# Patient Record
Sex: Female | Born: 1955 | Race: Black or African American | Hispanic: No | Marital: Married | Smoking: Never smoker
Health system: Southern US, Community
[De-identification: ages and names within clinical notes are randomized; demographics above are authoritative.]

## PROBLEM LIST (undated history)

## (undated) DIAGNOSIS — R51 Headache: Secondary | ICD-10-CM

## (undated) DIAGNOSIS — I10 Essential (primary) hypertension: Secondary | ICD-10-CM

## (undated) DIAGNOSIS — R011 Cardiac murmur, unspecified: Secondary | ICD-10-CM

## (undated) DIAGNOSIS — G8929 Other chronic pain: Secondary | ICD-10-CM

## (undated) DIAGNOSIS — E0590 Thyrotoxicosis, unspecified without thyrotoxic crisis or storm: Secondary | ICD-10-CM

## (undated) DIAGNOSIS — K219 Gastro-esophageal reflux disease without esophagitis: Secondary | ICD-10-CM

## (undated) DIAGNOSIS — D649 Anemia, unspecified: Secondary | ICD-10-CM

## (undated) DIAGNOSIS — R519 Headache, unspecified: Secondary | ICD-10-CM

## (undated) DIAGNOSIS — E049 Nontoxic goiter, unspecified: Secondary | ICD-10-CM

## (undated) HISTORY — PX: DILATION AND CURETTAGE OF UTERUS: SHX78

## (undated) HISTORY — PX: OTHER SURGICAL HISTORY: SHX169

## (undated) HISTORY — PX: ABDOMINAL HYSTERECTOMY: SHX81

---

## 1998-12-03 ENCOUNTER — Encounter: Payer: Self-pay | Admitting: Obstetrics and Gynecology

## 1998-12-03 ENCOUNTER — Ambulatory Visit (HOSPITAL_COMMUNITY): Admission: RE | Admit: 1998-12-03 | Discharge: 1998-12-03 | Payer: Self-pay | Admitting: Obstetrics and Gynecology

## 1999-10-22 ENCOUNTER — Ambulatory Visit (HOSPITAL_COMMUNITY): Admission: RE | Admit: 1999-10-22 | Discharge: 1999-10-22 | Payer: Self-pay | Admitting: Internal Medicine

## 1999-10-22 ENCOUNTER — Encounter: Payer: Self-pay | Admitting: Obstetrics and Gynecology

## 1999-10-27 ENCOUNTER — Inpatient Hospital Stay (HOSPITAL_COMMUNITY): Admission: RE | Admit: 1999-10-27 | Discharge: 1999-10-30 | Payer: Self-pay | Admitting: Obstetrics and Gynecology

## 1999-10-27 ENCOUNTER — Encounter (INDEPENDENT_AMBULATORY_CARE_PROVIDER_SITE_OTHER): Payer: Self-pay

## 2000-03-10 ENCOUNTER — Other Ambulatory Visit: Admission: RE | Admit: 2000-03-10 | Discharge: 2000-03-10 | Payer: Self-pay | Admitting: Obstetrics and Gynecology

## 2000-03-11 ENCOUNTER — Other Ambulatory Visit: Admission: RE | Admit: 2000-03-11 | Discharge: 2000-03-11 | Payer: Self-pay | Admitting: Endocrinology

## 2000-10-07 ENCOUNTER — Encounter (INDEPENDENT_AMBULATORY_CARE_PROVIDER_SITE_OTHER): Payer: Self-pay

## 2000-10-07 ENCOUNTER — Other Ambulatory Visit: Admission: RE | Admit: 2000-10-07 | Discharge: 2000-10-07 | Payer: Self-pay | Admitting: Obstetrics and Gynecology

## 2001-01-10 ENCOUNTER — Ambulatory Visit (HOSPITAL_COMMUNITY): Admission: RE | Admit: 2001-01-10 | Discharge: 2001-01-10 | Payer: Self-pay | Admitting: Obstetrics and Gynecology

## 2001-01-10 ENCOUNTER — Encounter: Payer: Self-pay | Admitting: Obstetrics and Gynecology

## 2001-03-07 ENCOUNTER — Encounter: Payer: Self-pay | Admitting: Gastroenterology

## 2001-03-07 ENCOUNTER — Encounter: Admission: RE | Admit: 2001-03-07 | Discharge: 2001-03-07 | Payer: Self-pay | Admitting: Gastroenterology

## 2001-06-03 ENCOUNTER — Other Ambulatory Visit: Admission: RE | Admit: 2001-06-03 | Discharge: 2001-06-03 | Payer: Self-pay | Admitting: Obstetrics and Gynecology

## 2002-02-18 ENCOUNTER — Encounter: Payer: Self-pay | Admitting: Internal Medicine

## 2002-02-18 ENCOUNTER — Ambulatory Visit (HOSPITAL_COMMUNITY): Admission: RE | Admit: 2002-02-18 | Discharge: 2002-02-18 | Payer: Self-pay | Admitting: Internal Medicine

## 2002-12-04 ENCOUNTER — Encounter: Payer: Self-pay | Admitting: Obstetrics and Gynecology

## 2002-12-04 ENCOUNTER — Ambulatory Visit (HOSPITAL_COMMUNITY): Admission: RE | Admit: 2002-12-04 | Discharge: 2002-12-04 | Payer: Self-pay | Admitting: Obstetrics and Gynecology

## 2003-01-19 ENCOUNTER — Other Ambulatory Visit: Admission: RE | Admit: 2003-01-19 | Discharge: 2003-01-19 | Payer: Self-pay | Admitting: Obstetrics and Gynecology

## 2003-07-13 ENCOUNTER — Encounter: Admission: RE | Admit: 2003-07-13 | Discharge: 2003-07-13 | Payer: Self-pay | Admitting: Internal Medicine

## 2004-03-27 ENCOUNTER — Encounter: Admission: RE | Admit: 2004-03-27 | Discharge: 2004-03-27 | Payer: Self-pay | Admitting: Internal Medicine

## 2004-06-04 ENCOUNTER — Ambulatory Visit (HOSPITAL_COMMUNITY): Admission: RE | Admit: 2004-06-04 | Discharge: 2004-06-04 | Payer: Self-pay | Admitting: Obstetrics and Gynecology

## 2005-05-27 ENCOUNTER — Ambulatory Visit (HOSPITAL_COMMUNITY): Admission: RE | Admit: 2005-05-27 | Discharge: 2005-05-27 | Payer: Self-pay | Admitting: Gastroenterology

## 2005-07-10 ENCOUNTER — Ambulatory Visit (HOSPITAL_COMMUNITY): Admission: RE | Admit: 2005-07-10 | Discharge: 2005-07-10 | Payer: Self-pay | Admitting: Obstetrics and Gynecology

## 2006-08-24 ENCOUNTER — Ambulatory Visit (HOSPITAL_COMMUNITY): Admission: RE | Admit: 2006-08-24 | Discharge: 2006-08-24 | Payer: Self-pay | Admitting: Obstetrics and Gynecology

## 2007-04-14 ENCOUNTER — Encounter: Admission: RE | Admit: 2007-04-14 | Discharge: 2007-04-14 | Payer: Self-pay | Admitting: Internal Medicine

## 2007-05-17 ENCOUNTER — Encounter (INDEPENDENT_AMBULATORY_CARE_PROVIDER_SITE_OTHER): Payer: Self-pay | Admitting: Interventional Radiology

## 2007-05-17 ENCOUNTER — Other Ambulatory Visit: Admission: RE | Admit: 2007-05-17 | Discharge: 2007-05-17 | Payer: Self-pay | Admitting: Interventional Radiology

## 2007-05-17 ENCOUNTER — Encounter: Admission: RE | Admit: 2007-05-17 | Discharge: 2007-05-17 | Payer: Self-pay | Admitting: Internal Medicine

## 2007-09-08 ENCOUNTER — Ambulatory Visit (HOSPITAL_COMMUNITY): Admission: RE | Admit: 2007-09-08 | Discharge: 2007-09-08 | Payer: Self-pay | Admitting: Internal Medicine

## 2007-09-14 ENCOUNTER — Encounter: Admission: RE | Admit: 2007-09-14 | Discharge: 2007-09-14 | Payer: Self-pay | Admitting: Internal Medicine

## 2007-11-15 ENCOUNTER — Encounter: Admission: RE | Admit: 2007-11-15 | Discharge: 2007-11-15 | Payer: Self-pay | Admitting: Endocrinology

## 2008-11-07 ENCOUNTER — Ambulatory Visit (HOSPITAL_COMMUNITY): Admission: RE | Admit: 2008-11-07 | Discharge: 2008-11-07 | Payer: Self-pay | Admitting: Obstetrics and Gynecology

## 2009-01-09 ENCOUNTER — Encounter: Admission: RE | Admit: 2009-01-09 | Discharge: 2009-01-09 | Payer: Self-pay | Admitting: Endocrinology

## 2009-07-17 ENCOUNTER — Encounter: Admission: RE | Admit: 2009-07-17 | Discharge: 2009-07-17 | Payer: Self-pay | Admitting: Endocrinology

## 2009-12-03 ENCOUNTER — Ambulatory Visit (HOSPITAL_COMMUNITY): Admission: RE | Admit: 2009-12-03 | Discharge: 2009-12-03 | Payer: Self-pay | Admitting: Obstetrics and Gynecology

## 2010-02-12 ENCOUNTER — Encounter: Admission: RE | Admit: 2010-02-12 | Discharge: 2010-02-12 | Payer: Self-pay | Admitting: Endocrinology

## 2010-06-13 ENCOUNTER — Encounter: Admission: RE | Admit: 2010-06-13 | Discharge: 2010-06-13 | Payer: Self-pay | Admitting: Internal Medicine

## 2010-08-31 ENCOUNTER — Encounter: Payer: Self-pay | Admitting: Internal Medicine

## 2010-11-05 ENCOUNTER — Other Ambulatory Visit (HOSPITAL_COMMUNITY): Payer: Self-pay | Admitting: Obstetrics and Gynecology

## 2010-11-05 DIAGNOSIS — Z1231 Encounter for screening mammogram for malignant neoplasm of breast: Secondary | ICD-10-CM

## 2010-12-08 ENCOUNTER — Ambulatory Visit (HOSPITAL_COMMUNITY)
Admission: RE | Admit: 2010-12-08 | Discharge: 2010-12-08 | Disposition: A | Discharge status: Home / Self Care | Payer: 59 | Source: Ambulatory Visit | Attending: Obstetrics and Gynecology | Admitting: Obstetrics and Gynecology

## 2010-12-08 DIAGNOSIS — Z1231 Encounter for screening mammogram for malignant neoplasm of breast: Secondary | ICD-10-CM

## 2010-12-26 NOTE — Op Note (Signed)
[name], [name]             ACCOUNT NO.:  [id number]   MEDICAL RECORD NO.:  [id number]          PATIENT TYPE:  AMB   LOCATION:  ENDO                         FACILITY:  [facility]   PHYSICIAN:  Danise Edge, M.D.   DATE OF BIRTH:  1956/06/29   DATE OF PROCEDURE:  05/27/2005  DATE OF DISCHARGE:                                 OPERATIVE REPORT   PROCEDURE:  Screening colonoscopy.   INDICATIONS:  [name] is a 55 year old female born August 07, 1956. [name] is scheduled to undergo her first screening colonoscopy  with polypectomy to prevent colon cancer.   ENDOSCOPIST:  Danise Edge, M.D.   PREMEDICATION:  Versed 4 mg, Demerol 50 mg.   DESCRIPTION OF PROCEDURE:  After obtaining informed consent, [name]  was placed in the left lateral decubitus position. I administered  intravenous Demerol and intravenous Versed to achieve conscious sedation for  the procedure. The patient's blood pressure, oxygen saturation and cardiac  rhythm were monitored throughout the procedure and documented in the medical  record.   Anal inspection and digital rectal exam were normal. The Olympus adjustable  pediatric colonoscope was introduced into the rectum and advanced to the  cecum. A normal-appearing appendiceal orifice and ileocecal valve were  identified. Colonic preparation for the exam today was excellent.   RECTUM:  Normal. Retroflexed view of the distal rectum normal.  SIGMOID COLON AND DESCENDING COLON:  Normal.  SPLENIC FLEXURE:  Normal.  TRANSVERSE COLON:  Normal.  HEPATIC FLEXURE:  Normal.  ASCENDING COLON:  Normal.  CECUM AND ILEOCECAL VALVE:  Normal.   ASSESSMENT:  Normal screening proctocolonoscopy to the cecum.           ______________________________  Danise Edge, M.D.     MJ/MEDQ  D:  05/27/2005  T:  05/27/2005  Job:  [id number]   cc:   Candyce Churn, M.D.  Fax: [phone]

## 2012-01-11 ENCOUNTER — Other Ambulatory Visit (HOSPITAL_COMMUNITY): Payer: Self-pay | Admitting: Obstetrics and Gynecology

## 2012-01-11 DIAGNOSIS — Z1231 Encounter for screening mammogram for malignant neoplasm of breast: Secondary | ICD-10-CM

## 2012-02-09 ENCOUNTER — Ambulatory Visit (HOSPITAL_COMMUNITY)
Admission: RE | Admit: 2012-02-09 | Discharge: 2012-02-09 | Disposition: A | Discharge status: Home / Self Care | Payer: 59 | Source: Ambulatory Visit | Attending: Obstetrics and Gynecology | Admitting: Obstetrics and Gynecology

## 2012-02-09 DIAGNOSIS — Z1231 Encounter for screening mammogram for malignant neoplasm of breast: Secondary | ICD-10-CM | POA: Insufficient documentation

## 2013-04-20 ENCOUNTER — Other Ambulatory Visit (HOSPITAL_COMMUNITY): Payer: Self-pay | Admitting: Obstetrics and Gynecology

## 2013-04-20 DIAGNOSIS — Z1231 Encounter for screening mammogram for malignant neoplasm of breast: Secondary | ICD-10-CM

## 2013-04-27 ENCOUNTER — Ambulatory Visit (HOSPITAL_COMMUNITY)
Admission: RE | Admit: 2013-04-27 | Discharge: 2013-04-27 | Disposition: A | Discharge status: Home / Self Care | Payer: 59 | Source: Ambulatory Visit | Attending: Obstetrics and Gynecology | Admitting: Obstetrics and Gynecology

## 2013-04-27 DIAGNOSIS — Z1231 Encounter for screening mammogram for malignant neoplasm of breast: Secondary | ICD-10-CM | POA: Insufficient documentation

## 2013-05-11 ENCOUNTER — Other Ambulatory Visit: Payer: Self-pay | Admitting: Endocrinology

## 2013-05-11 DIAGNOSIS — E049 Nontoxic goiter, unspecified: Secondary | ICD-10-CM

## 2013-06-14 ENCOUNTER — Other Ambulatory Visit: Payer: Self-pay | Admitting: Internal Medicine

## 2013-06-14 DIAGNOSIS — M542 Cervicalgia: Secondary | ICD-10-CM

## 2013-06-24 ENCOUNTER — Ambulatory Visit
Admission: RE | Admit: 2013-06-24 | Discharge: 2013-06-24 | Disposition: A | Discharge status: Home / Self Care | Payer: BC Managed Care – PPO | Source: Ambulatory Visit | Attending: Internal Medicine | Admitting: Internal Medicine

## 2013-06-24 DIAGNOSIS — M542 Cervicalgia: Secondary | ICD-10-CM

## 2014-05-04 ENCOUNTER — Ambulatory Visit
Admission: RE | Admit: 2014-05-04 | Discharge: 2014-05-04 | Disposition: A | Discharge status: Home / Self Care | Payer: 59 | Source: Ambulatory Visit | Attending: Endocrinology | Admitting: Endocrinology

## 2014-05-04 ENCOUNTER — Encounter (INDEPENDENT_AMBULATORY_CARE_PROVIDER_SITE_OTHER): Payer: Self-pay

## 2014-05-04 DIAGNOSIS — E049 Nontoxic goiter, unspecified: Secondary | ICD-10-CM

## 2014-05-30 ENCOUNTER — Other Ambulatory Visit (HOSPITAL_COMMUNITY): Payer: Self-pay | Admitting: Obstetrics and Gynecology

## 2014-05-30 DIAGNOSIS — Z1231 Encounter for screening mammogram for malignant neoplasm of breast: Secondary | ICD-10-CM

## 2014-06-07 ENCOUNTER — Ambulatory Visit (HOSPITAL_COMMUNITY)
Admission: RE | Admit: 2014-06-07 | Discharge: 2014-06-07 | Disposition: A | Discharge status: Home / Self Care | Payer: 59 | Source: Ambulatory Visit | Attending: Obstetrics and Gynecology | Admitting: Obstetrics and Gynecology

## 2014-06-07 DIAGNOSIS — Z1231 Encounter for screening mammogram for malignant neoplasm of breast: Secondary | ICD-10-CM | POA: Insufficient documentation

## 2014-08-10 DIAGNOSIS — R011 Cardiac murmur, unspecified: Secondary | ICD-10-CM

## 2014-08-10 HISTORY — DX: Cardiac murmur, unspecified: R01.1

## 2015-04-18 ENCOUNTER — Other Ambulatory Visit: Payer: Self-pay | Admitting: Nurse Practitioner

## 2015-04-18 DIAGNOSIS — R0989 Other specified symptoms and signs involving the circulatory and respiratory systems: Secondary | ICD-10-CM

## 2015-04-19 ENCOUNTER — Other Ambulatory Visit (HOSPITAL_COMMUNITY): Payer: Self-pay | Admitting: Internal Medicine

## 2015-04-19 DIAGNOSIS — I10 Essential (primary) hypertension: Secondary | ICD-10-CM

## 2015-04-22 ENCOUNTER — Ambulatory Visit (HOSPITAL_COMMUNITY): Payer: 59 | Attending: Cardiovascular Disease

## 2015-04-22 ENCOUNTER — Other Ambulatory Visit: Payer: Self-pay

## 2015-04-22 DIAGNOSIS — R011 Cardiac murmur, unspecified: Secondary | ICD-10-CM | POA: Insufficient documentation

## 2015-04-22 DIAGNOSIS — Z8249 Family history of ischemic heart disease and other diseases of the circulatory system: Secondary | ICD-10-CM | POA: Insufficient documentation

## 2015-04-22 DIAGNOSIS — I10 Essential (primary) hypertension: Secondary | ICD-10-CM | POA: Diagnosis not present

## 2015-04-23 ENCOUNTER — Ambulatory Visit
Admission: RE | Admit: 2015-04-23 | Discharge: 2015-04-23 | Disposition: A | Discharge status: Home / Self Care | Payer: 59 | Source: Ambulatory Visit | Attending: Nurse Practitioner | Admitting: Nurse Practitioner

## 2015-04-23 DIAGNOSIS — R0989 Other specified symptoms and signs involving the circulatory and respiratory systems: Secondary | ICD-10-CM

## 2015-08-11 HISTORY — PX: OTHER SURGICAL HISTORY: SHX169

## 2016-09-24 ENCOUNTER — Other Ambulatory Visit: Payer: Self-pay | Admitting: Gastroenterology

## 2016-11-19 ENCOUNTER — Encounter (HOSPITAL_COMMUNITY): Payer: Self-pay | Admitting: *Deleted

## 2016-11-24 ENCOUNTER — Encounter (HOSPITAL_COMMUNITY)
Admission: RE | Disposition: A | Discharge status: Home / Self Care | Payer: Self-pay | Source: Ambulatory Visit | Attending: Gastroenterology

## 2016-11-24 ENCOUNTER — Ambulatory Visit (HOSPITAL_COMMUNITY)
Admission: RE | Admit: 2016-11-24 | Discharge: 2016-11-24 | Disposition: A | Discharge status: Home / Self Care | Payer: 59 | Source: Ambulatory Visit | Attending: Gastroenterology | Admitting: Gastroenterology

## 2016-11-24 ENCOUNTER — Ambulatory Visit (HOSPITAL_COMMUNITY): Payer: 59 | Admitting: Anesthesiology

## 2016-11-24 ENCOUNTER — Encounter (HOSPITAL_COMMUNITY): Payer: Self-pay | Admitting: *Deleted

## 2016-11-24 DIAGNOSIS — I10 Essential (primary) hypertension: Secondary | ICD-10-CM | POA: Diagnosis not present

## 2016-11-24 DIAGNOSIS — Z90710 Acquired absence of both cervix and uterus: Secondary | ICD-10-CM | POA: Diagnosis not present

## 2016-11-24 DIAGNOSIS — M797 Fibromyalgia: Secondary | ICD-10-CM | POA: Insufficient documentation

## 2016-11-24 DIAGNOSIS — D127 Benign neoplasm of rectosigmoid junction: Secondary | ICD-10-CM | POA: Insufficient documentation

## 2016-11-24 DIAGNOSIS — E039 Hypothyroidism, unspecified: Secondary | ICD-10-CM | POA: Diagnosis not present

## 2016-11-24 DIAGNOSIS — Z1211 Encounter for screening for malignant neoplasm of colon: Secondary | ICD-10-CM | POA: Diagnosis present

## 2016-11-24 DIAGNOSIS — M5030 Other cervical disc degeneration, unspecified cervical region: Secondary | ICD-10-CM | POA: Diagnosis not present

## 2016-11-24 DIAGNOSIS — Z9851 Tubal ligation status: Secondary | ICD-10-CM | POA: Diagnosis not present

## 2016-11-24 HISTORY — DX: Other chronic pain: G89.29

## 2016-11-24 HISTORY — DX: Headache: R51

## 2016-11-24 HISTORY — DX: Thyrotoxicosis, unspecified without thyrotoxic crisis or storm: E05.90

## 2016-11-24 HISTORY — DX: Nontoxic goiter, unspecified: E04.9

## 2016-11-24 HISTORY — PX: COLONOSCOPY WITH PROPOFOL: SHX5780

## 2016-11-24 HISTORY — DX: Cardiac murmur, unspecified: R01.1

## 2016-11-24 HISTORY — DX: Anemia, unspecified: D64.9

## 2016-11-24 HISTORY — DX: Essential (primary) hypertension: I10

## 2016-11-24 HISTORY — DX: Headache, unspecified: R51.9

## 2016-11-24 HISTORY — DX: Gastro-esophageal reflux disease without esophagitis: K21.9

## 2016-11-24 SURGERY — COLONOSCOPY WITH PROPOFOL
Anesthesia: Monitor Anesthesia Care

## 2016-11-24 MED ORDER — LACTATED RINGERS IV SOLN
INTRAVENOUS | Status: DC
  Administered 2016-11-24: 1000 mL via INTRAVENOUS

## 2016-11-24 MED ORDER — PROPOFOL 500 MG/50ML IV EMUL
INTRAVENOUS | Status: DC | PRN
  Administered 2016-11-24: 30 mg via INTRAVENOUS

## 2016-11-24 MED ORDER — PROPOFOL 500 MG/50ML IV EMUL
INTRAVENOUS | Status: DC | PRN
  Administered 2016-11-24: 125 ug/kg/min via INTRAVENOUS

## 2016-11-24 MED ORDER — SODIUM CHLORIDE 0.9 % IV SOLN: INTRAVENOUS | Status: DC

## 2016-11-24 MED ORDER — PROPOFOL 10 MG/ML IV BOLUS
INTRAVENOUS | Status: AC
  Filled 2016-11-24: qty 40

## 2016-11-24 MED ORDER — PROPOFOL 10 MG/ML IV BOLUS
INTRAVENOUS | Status: AC
  Filled 2016-11-24: qty 20

## 2016-11-24 SURGICAL SUPPLY — 22 items

## 2016-11-24 NOTE — Op Note (Signed)
[facility] Patient Name: Tonya Sims Procedure Date: 11/24/2016 MRN: [id number] Attending MD: Garlan Fair , MD Date of Birth: Aug 08, 1956 CSN: [id number] Age: 61 Admit Type: Outpatient Procedure:                Colonoscopy Indications:              Screening for colorectal malignant neoplasm Providers:                Garlan Fair, MD, Zenon Mayo, RN, Cherylynn Ridges, Technician, Herbie Drape, CRNA Referring MD:              Medicines:                Propofol per Anesthesia Complications:            No immediate complications. Estimated Blood Loss:     Estimated blood loss: none. Procedure:                Pre-Anesthesia Assessment:                           - Prior to the procedure, a History and Physical                            was performed, and patient medications and                            allergies were reviewed. The patient's tolerance of                            previous anesthesia was also reviewed. The risks                            and benefits of the procedure and the sedation                            options and risks were discussed with the patient.                            All questions were answered, and informed consent                            was obtained. Prior Anticoagulants: The patient has                            taken no previous anticoagulant or antiplatelet                            agents. ASA Grade Assessment: II - A patient with                            mild systemic disease. After reviewing the risks  and benefits, the patient was deemed in                            satisfactory condition to undergo the procedure.                           After obtaining informed consent, the colonoscope                            was passed under direct vision. Throughout the                            procedure, the patient's blood pressure, pulse, and                   oxygen saturations were monitored continuously. The                            was introduced through the anus and advanced to the                            the cecum, identified by appendiceal orifice and                            ileocecal valve. The colonoscopy was performed                            without difficulty. The patient tolerated the                            procedure well. The quality of the bowel                            preparation was good. The appendiceal orifice and                            the rectum were photographed. Scope In: 1:37:03 PM Scope Out: 1:53:38 PM Scope Withdrawal Time: 0 hours 9 minutes 8 seconds  Total Procedure Duration: 0 hours 16 minutes 35 seconds  Findings:      The perianal and digital rectal examinations were normal.      The entire examined colon appeared normal except for the removal of       three diminuitive hyperplastic appearing sessile polyps from the       rectosigmoid junction with the cold biopsy forceps. Impression:               - The entire examined colon is normal except for                            the removal of three diminuitive hyperplastic                            appearing polyps . Moderate Sedation:      N/A- Per Anesthesia Care Recommendation:           - Patient has a contact number available  for                            emergencies. The signs and symptoms of potential                            delayed complications were discussed with the                            patient. Return to normal activities tomorrow.                            Written discharge instructions were provided to the                            patient.                           - Repeat colonoscopy date to be determined after                            pending pathology results are reviewed for                            screening purposes.                           - Resume previous diet.                            - Continue present medications. Procedure Code(s):        --- Professional ---                           B2841, Colorectal cancer screening; colonoscopy on                            individual not meeting criteria for high risk Diagnosis Code(s):        --- Professional ---                           Z12.11, Encounter for screening for malignant                            neoplasm of colon CPT copyright 2016 American [employer] Association. All rights reserved. The codes documented in this report are preliminary and upon coder review may  be revised to meet current compliance requirements. Earle Gell, MD Garlan Fair, MD 11/24/2016 2:01:11 PM This report has been signed electronically. Number of Addenda: 0

## 2016-11-24 NOTE — Anesthesia Postprocedure Evaluation (Signed)
Anesthesia Post Note  Patient: Tonya Sims  Procedure(s) Performed: Procedure(s) (LRB): COLONOSCOPY WITH PROPOFOL (N/A)  Patient location during evaluation: PACU Anesthesia Type: MAC Level of consciousness: awake and alert and oriented Pain management: pain level controlled Vital Signs Assessment: post-procedure vital signs reviewed and stable Respiratory status: spontaneous breathing, nonlabored ventilation and respiratory function stable Cardiovascular status: stable and blood pressure returned to baseline Postop Assessment: no signs of nausea or vomiting Anesthetic complications: no       Last Vitals:  Vitals:   11/24/16 1306 11/24/16 1400  BP: (!) 199/73 (!) 138/106  Pulse: 99 62  Resp: 11 17  Temp: 37 C 36.4 C    Last Pain:  Vitals:   11/24/16 1400  TempSrc: Oral                 Cleon Thoma A.

## 2016-11-24 NOTE — H&P (Signed)
Procedure: Screening colonoscopy. Normal screening colonoscopy was performed on 05/27/2005  History: The patient is a 61 year old female born April 11, 1956. She is scheduled to undergo a screening colonoscopy today.  Past medical history: Hypertension. Hypothyroidism. Cervical degenerative disc disease. Migraine headache syndrome. Fibromyalgia syndrome. Allergic rhinitis. Thyroid goiter surgery. D&C. Umbilical hernia repair. Tubal ligation. Total abdominal hysterectomy with BSO.  Exam: The patient is alert and lying comfortably on the endoscopy stretcher. Cardiac exam reveals a regular rhythm. Lungs are clear to auscultation. Abdomen is soft and nontender to palpation.  Plan: Proceed with screening colonoscopy

## 2016-11-24 NOTE — Discharge Instructions (Signed)

## 2016-11-24 NOTE — Transfer of Care (Signed)
Immediate Anesthesia Transfer of Care Note  Patient: Tonya Sims  Procedure(s) Performed: Procedure(s): COLONOSCOPY WITH PROPOFOL (N/A)  Patient Location: PACU  Anesthesia Type:MAC  Level of Consciousness: sedated, patient cooperative and responds to stimulation  Airway & Oxygen Therapy: Patient Spontanous Breathing and Patient connected to face mask oxygen  Post-op Assessment: Report given to RN and Post -op Vital signs reviewed and stable  Post vital signs: Reviewed and stable  Last Vitals:  Vitals:   11/24/16 1306  BP: (!) 199/73  Pulse: 99  Resp: 11  Temp: 37 C    Last Pain:  Vitals:   11/24/16 1306  TempSrc: Oral         Complications: No apparent anesthesia complications

## 2016-11-24 NOTE — Anesthesia Preprocedure Evaluation (Signed)
Anesthesia Evaluation  Patient identified by MRN, date of birth, ID band Patient awake    Reviewed: Allergy & Precautions, H&P , NPO status , Patient's Chart, lab work & pertinent test results  Airway Mallampati: II   Neck ROM: full    Dental   Pulmonary neg pulmonary ROS,    breath sounds clear to auscultation       Cardiovascular hypertension,  Rhythm:regular Rate:Normal     Neuro/Psych  Headaches,    GI/Hepatic GERD  ,  Endo/Other  Hyperthyroidism   Renal/GU      Musculoskeletal   Abdominal   Peds  Hematology  (+) anemia ,   Anesthesia Other Findings   Reproductive/Obstetrics                             Anesthesia Physical Anesthesia Plan  ASA: II  Anesthesia Plan: MAC   Post-op Pain Management:    Induction: Intravenous  Airway Management Planned: Simple Face Mask  Additional Equipment:   Intra-op Plan:   Post-operative Plan:   Informed Consent: I have reviewed the patients History and Physical, chart, labs and discussed the procedure including the risks, benefits and alternatives for the proposed anesthesia with the patient or authorized representative who has indicated his/her understanding and acceptance.     Plan Discussed with: CRNA, Anesthesiologist and Surgeon  Anesthesia Plan Comments:         Anesthesia Quick Evaluation

## 2016-11-25 ENCOUNTER — Encounter (HOSPITAL_COMMUNITY): Payer: Self-pay | Admitting: Gastroenterology

## 2017-07-28 ENCOUNTER — Other Ambulatory Visit: Payer: Self-pay | Admitting: Nephrology

## 2017-07-28 DIAGNOSIS — I10 Essential (primary) hypertension: Secondary | ICD-10-CM

## 2017-08-13 ENCOUNTER — Ambulatory Visit
Admission: RE | Admit: 2017-08-13 | Discharge: 2017-08-13 | Disposition: A | Discharge status: Home / Self Care | Payer: 59 | Source: Ambulatory Visit | Attending: Nephrology | Admitting: Nephrology

## 2017-08-13 DIAGNOSIS — I10 Essential (primary) hypertension: Secondary | ICD-10-CM

## 2018-06-02 ENCOUNTER — Other Ambulatory Visit: Payer: Self-pay | Admitting: Endocrinology

## 2018-06-02 DIAGNOSIS — E049 Nontoxic goiter, unspecified: Secondary | ICD-10-CM

## 2018-06-07 ENCOUNTER — Ambulatory Visit
Admission: RE | Admit: 2018-06-07 | Discharge: 2018-06-07 | Disposition: A | Discharge status: Home / Self Care | Payer: BC Managed Care – PPO | Source: Ambulatory Visit | Attending: Endocrinology | Admitting: Endocrinology

## 2018-06-07 DIAGNOSIS — E049 Nontoxic goiter, unspecified: Secondary | ICD-10-CM

## 2019-06-13 ENCOUNTER — Other Ambulatory Visit: Payer: Self-pay | Admitting: Endocrinology

## 2019-06-13 DIAGNOSIS — E049 Nontoxic goiter, unspecified: Secondary | ICD-10-CM

## 2019-06-22 ENCOUNTER — Ambulatory Visit
Admission: RE | Admit: 2019-06-22 | Discharge: 2019-06-22 | Disposition: A | Discharge status: Home / Self Care | Payer: BC Managed Care – PPO | Source: Ambulatory Visit | Attending: Endocrinology | Admitting: Endocrinology

## 2019-06-22 DIAGNOSIS — E049 Nontoxic goiter, unspecified: Secondary | ICD-10-CM

## 2019-09-30 ENCOUNTER — Ambulatory Visit: Payer: BC Managed Care – PPO | Attending: Internal Medicine

## 2019-09-30 DIAGNOSIS — Z23 Encounter for immunization: Secondary | ICD-10-CM | POA: Insufficient documentation

## 2019-09-30 NOTE — Progress Notes (Signed)
   Covid-19 Vaccination Clinic  Name:  GOLIE SIMONI    MRN: [id number] DOB: [birth year]  09/30/2019  Ms. Demeester was observed post Covid-19 immunization for 15 minutes without incidence. She was provided with Vaccine Information Sheet and instruction to access the V-Safe system.   Ms. Beine was instructed to call 911 with any severe reactions post vaccine: Marland Kitchen Difficulty breathing  . Swelling of your face and throat  . A fast heartbeat  . A bad rash all over your body  . Dizziness and weakness    Immunizations Administered    Name Date Dose VIS Date Route   Pfizer COVID-19 Vaccine 09/30/2019 12:01 PM 0.3 mL 07/21/2019 Intramuscular   Manufacturer: [place]   Lot: [id number]   [place]: [id number]

## 2019-10-23 ENCOUNTER — Ambulatory Visit: Payer: Medicare PPO | Attending: Internal Medicine

## 2019-10-23 DIAGNOSIS — Z23 Encounter for immunization: Secondary | ICD-10-CM

## 2019-10-23 NOTE — Progress Notes (Signed)
   Covid-19 Vaccination Clinic  Name:  REGENE EAPEN    MRN: [id number] DOB: 1956/03/05  10/23/2019  Ms. Hermosillo was observed post Covid-19 immunization for 15 minutes without incident. She was provided with Vaccine Information Sheet and instruction to access the V-Safe system.   Ms. Cuadros was instructed to call 911 with any severe reactions post vaccine: Marland Kitchen Difficulty breathing  . Swelling of face and throat  . A fast heartbeat  . A bad rash all over body  . Dizziness and weakness   Immunizations Administered    Name Date Dose VIS Date Route   Pfizer COVID-19 Vaccine 10/23/2019  8:46 AM 0.3 mL 07/21/2019 Intramuscular   Manufacturer: [place]   Lot: [id number]   [place]: [id number]

## 2020-05-08 DIAGNOSIS — I10 Essential (primary) hypertension: Secondary | ICD-10-CM | POA: Diagnosis not present

## 2020-05-08 DIAGNOSIS — D509 Iron deficiency anemia, unspecified: Secondary | ICD-10-CM | POA: Diagnosis not present

## 2020-05-08 DIAGNOSIS — E039 Hypothyroidism, unspecified: Secondary | ICD-10-CM | POA: Diagnosis not present

## 2020-05-08 DIAGNOSIS — I129 Hypertensive chronic kidney disease with stage 1 through stage 4 chronic kidney disease, or unspecified chronic kidney disease: Secondary | ICD-10-CM | POA: Diagnosis not present

## 2020-05-08 DIAGNOSIS — N2581 Secondary hyperparathyroidism of renal origin: Secondary | ICD-10-CM | POA: Diagnosis not present

## 2020-05-08 DIAGNOSIS — N182 Chronic kidney disease, stage 2 (mild): Secondary | ICD-10-CM | POA: Diagnosis not present

## 2020-05-25 ENCOUNTER — Ambulatory Visit: Payer: Medicare PPO | Attending: Internal Medicine

## 2020-05-25 DIAGNOSIS — Z23 Encounter for immunization: Secondary | ICD-10-CM

## 2020-05-25 NOTE — Progress Notes (Signed)
   Covid-19 Vaccination Clinic  Name:  VELECIA OVITT    MRN: [id number] DOB: 1955-10-08  05/25/2020  Ms. Haan was observed post Covid-19 immunization for 15 minutes without incident. She was provided with Vaccine Information Sheet and instruction to access the V-Safe system.   Ms. Whittingham was instructed to call 911 with any severe reactions post vaccine: Marland Kitchen Difficulty breathing  . Swelling of face and throat  . A fast heartbeat  . A bad rash all over body  . Dizziness and weakness

## 2020-05-28 DIAGNOSIS — E890 Postprocedural hypothyroidism: Secondary | ICD-10-CM | POA: Diagnosis not present

## 2020-05-30 DIAGNOSIS — H16223 Keratoconjunctivitis sicca, not specified as Sjogren's, bilateral: Secondary | ICD-10-CM | POA: Diagnosis not present

## 2020-06-04 DIAGNOSIS — E049 Nontoxic goiter, unspecified: Secondary | ICD-10-CM | POA: Diagnosis not present

## 2020-06-04 DIAGNOSIS — E890 Postprocedural hypothyroidism: Secondary | ICD-10-CM | POA: Diagnosis not present

## 2020-06-04 DIAGNOSIS — I10 Essential (primary) hypertension: Secondary | ICD-10-CM | POA: Diagnosis not present

## 2020-06-24 DIAGNOSIS — Z23 Encounter for immunization: Secondary | ICD-10-CM | POA: Diagnosis not present

## 2020-07-31 DIAGNOSIS — E890 Postprocedural hypothyroidism: Secondary | ICD-10-CM | POA: Diagnosis not present

## 2020-10-10 ENCOUNTER — Other Ambulatory Visit: Payer: Self-pay | Admitting: Internal Medicine

## 2020-10-10 DIAGNOSIS — Z0001 Encounter for general adult medical examination with abnormal findings: Secondary | ICD-10-CM | POA: Diagnosis not present

## 2020-10-10 DIAGNOSIS — E559 Vitamin D deficiency, unspecified: Secondary | ICD-10-CM | POA: Diagnosis not present

## 2020-10-10 DIAGNOSIS — Z79899 Other long term (current) drug therapy: Secondary | ICD-10-CM | POA: Diagnosis not present

## 2020-10-10 DIAGNOSIS — I779 Disorder of arteries and arterioles, unspecified: Secondary | ICD-10-CM | POA: Diagnosis not present

## 2020-10-10 DIAGNOSIS — E039 Hypothyroidism, unspecified: Secondary | ICD-10-CM | POA: Diagnosis not present

## 2020-10-10 DIAGNOSIS — Z13820 Encounter for screening for osteoporosis: Secondary | ICD-10-CM

## 2020-10-10 DIAGNOSIS — G4700 Insomnia, unspecified: Secondary | ICD-10-CM | POA: Diagnosis not present

## 2020-10-10 DIAGNOSIS — I10 Essential (primary) hypertension: Secondary | ICD-10-CM | POA: Diagnosis not present

## 2020-10-10 DIAGNOSIS — D509 Iron deficiency anemia, unspecified: Secondary | ICD-10-CM | POA: Diagnosis not present

## 2020-10-15 ENCOUNTER — Other Ambulatory Visit: Payer: Medicare PPO

## 2020-10-16 DIAGNOSIS — I5032 Chronic diastolic (congestive) heart failure: Secondary | ICD-10-CM | POA: Diagnosis not present

## 2020-10-16 DIAGNOSIS — N1831 Chronic kidney disease, stage 3a: Secondary | ICD-10-CM | POA: Diagnosis not present

## 2020-10-16 DIAGNOSIS — I129 Hypertensive chronic kidney disease with stage 1 through stage 4 chronic kidney disease, or unspecified chronic kidney disease: Secondary | ICD-10-CM | POA: Diagnosis not present

## 2020-10-16 DIAGNOSIS — K219 Gastro-esophageal reflux disease without esophagitis: Secondary | ICD-10-CM | POA: Diagnosis not present

## 2020-10-16 DIAGNOSIS — N182 Chronic kidney disease, stage 2 (mild): Secondary | ICD-10-CM | POA: Diagnosis not present

## 2020-10-23 DIAGNOSIS — Z1231 Encounter for screening mammogram for malignant neoplasm of breast: Secondary | ICD-10-CM | POA: Diagnosis not present

## 2020-11-26 ENCOUNTER — Other Ambulatory Visit: Payer: Self-pay

## 2020-11-26 ENCOUNTER — Ambulatory Visit
Admission: RE | Admit: 2020-11-26 | Discharge: 2020-11-26 | Disposition: A | Discharge status: Home / Self Care | Payer: Medicare PPO | Source: Ambulatory Visit | Attending: Internal Medicine | Admitting: Internal Medicine

## 2020-11-26 DIAGNOSIS — Z780 Asymptomatic menopausal state: Secondary | ICD-10-CM | POA: Diagnosis not present

## 2020-11-26 DIAGNOSIS — Z13820 Encounter for screening for osteoporosis: Secondary | ICD-10-CM

## 2021-01-03 ENCOUNTER — Ambulatory Visit: Payer: Medicare PPO | Attending: Family

## 2021-01-03 DIAGNOSIS — Z23 Encounter for immunization: Secondary | ICD-10-CM

## 2021-01-03 NOTE — Progress Notes (Signed)
   Covid-19 Vaccination Clinic  Name:  ANTONEA GAUT    MRN: [id number] DOB: 1956/05/04  01/03/2021  Ms. Lukach was observed post Covid-19 immunization for 15 minutes without incident. She was provided with Vaccine Information Sheet and instruction to access the V-Safe system.   Ms. Oscarson was instructed to call 911 with any severe reactions post vaccine: Marland Kitchen Difficulty breathing  . Swelling of face and throat  . A fast heartbeat  . A bad rash all over body  . Dizziness and weakness   Immunizations Administered    Name Date Dose VIS Date Route   Pfizer COVID-19 Vaccine 01/03/2021  3:07 PM 0.3 mL 05/29/2020 Intramuscular   Manufacturer: [place]   Lot: [id number]   [place]: [address]-3

## 2021-04-17 DIAGNOSIS — N1831 Chronic kidney disease, stage 3a: Secondary | ICD-10-CM | POA: Diagnosis not present

## 2021-04-17 DIAGNOSIS — I129 Hypertensive chronic kidney disease with stage 1 through stage 4 chronic kidney disease, or unspecified chronic kidney disease: Secondary | ICD-10-CM | POA: Diagnosis not present

## 2021-04-17 DIAGNOSIS — I5032 Chronic diastolic (congestive) heart failure: Secondary | ICD-10-CM | POA: Diagnosis not present

## 2021-04-17 DIAGNOSIS — E049 Nontoxic goiter, unspecified: Secondary | ICD-10-CM | POA: Diagnosis not present

## 2021-05-26 DIAGNOSIS — Z0000 Encounter for general adult medical examination without abnormal findings: Secondary | ICD-10-CM | POA: Diagnosis not present

## 2021-05-26 DIAGNOSIS — Z124 Encounter for screening for malignant neoplasm of cervix: Secondary | ICD-10-CM | POA: Diagnosis not present

## 2021-05-26 DIAGNOSIS — Z90710 Acquired absence of both cervix and uterus: Secondary | ICD-10-CM | POA: Diagnosis not present

## 2021-05-28 DIAGNOSIS — E890 Postprocedural hypothyroidism: Secondary | ICD-10-CM | POA: Diagnosis not present

## 2021-06-04 DIAGNOSIS — E890 Postprocedural hypothyroidism: Secondary | ICD-10-CM | POA: Diagnosis not present

## 2021-06-04 DIAGNOSIS — I10 Essential (primary) hypertension: Secondary | ICD-10-CM | POA: Diagnosis not present

## 2021-06-04 DIAGNOSIS — E049 Nontoxic goiter, unspecified: Secondary | ICD-10-CM | POA: Diagnosis not present

## 2021-06-06 ENCOUNTER — Other Ambulatory Visit: Payer: Self-pay | Admitting: Endocrinology

## 2021-06-06 DIAGNOSIS — E049 Nontoxic goiter, unspecified: Secondary | ICD-10-CM

## 2021-06-06 DIAGNOSIS — Z23 Encounter for immunization: Secondary | ICD-10-CM | POA: Diagnosis not present

## 2021-06-23 ENCOUNTER — Ambulatory Visit
Admission: RE | Admit: 2021-06-23 | Discharge: 2021-06-23 | Disposition: A | Discharge status: Home / Self Care | Payer: Medicare PPO | Source: Ambulatory Visit | Attending: Endocrinology | Admitting: Endocrinology

## 2021-06-23 DIAGNOSIS — E049 Nontoxic goiter, unspecified: Secondary | ICD-10-CM | POA: Diagnosis not present

## 2021-06-23 DIAGNOSIS — E890 Postprocedural hypothyroidism: Secondary | ICD-10-CM | POA: Diagnosis not present

## 2021-06-25 ENCOUNTER — Ambulatory Visit: Payer: Medicare PPO | Attending: Family

## 2021-06-25 DIAGNOSIS — Z23 Encounter for immunization: Secondary | ICD-10-CM

## 2021-06-25 NOTE — Progress Notes (Signed)
   Covid-19 Vaccination Clinic  Name:  Tonya Sims    MRN: [id number] DOB: Oct 25, 1955  06/25/2021  Ms. Stickley was observed post Covid-19 immunization for 15 minutes without incident. She was provided with Vaccine Information Sheet and instruction to access the V-Safe system.   Ms. Jasmer was instructed to call 911 with any severe reactions post vaccine: Difficulty breathing  Swelling of face and throat  A fast heartbeat  A bad rash all over body  Dizziness and weakness   Immunizations Administered     Name Date Dose VIS Date Route   Pfizer Covid-19 Vaccine Bivalent Booster 06/25/2021 11:00 AM 0.3 mL 04/09/2021 Intramuscular   Manufacturer: [place]   Lot: [id number]   [place]: [phone]

## 2021-08-06 DIAGNOSIS — E890 Postprocedural hypothyroidism: Secondary | ICD-10-CM | POA: Diagnosis not present

## 2021-10-15 ENCOUNTER — Other Ambulatory Visit: Payer: Self-pay | Admitting: Internal Medicine

## 2021-10-15 ENCOUNTER — Ambulatory Visit
Admission: RE | Admit: 2021-10-15 | Discharge: 2021-10-15 | Disposition: A | Discharge status: Home / Self Care | Payer: Medicare PPO | Source: Ambulatory Visit | Attending: Internal Medicine | Admitting: Internal Medicine

## 2021-10-15 DIAGNOSIS — M25562 Pain in left knee: Secondary | ICD-10-CM

## 2021-10-15 DIAGNOSIS — K219 Gastro-esophageal reflux disease without esophagitis: Secondary | ICD-10-CM | POA: Diagnosis not present

## 2021-10-15 DIAGNOSIS — E039 Hypothyroidism, unspecified: Secondary | ICD-10-CM | POA: Diagnosis not present

## 2021-10-15 DIAGNOSIS — Z23 Encounter for immunization: Secondary | ICD-10-CM | POA: Diagnosis not present

## 2021-10-15 DIAGNOSIS — I779 Disorder of arteries and arterioles, unspecified: Secondary | ICD-10-CM | POA: Diagnosis not present

## 2021-10-15 DIAGNOSIS — I10 Essential (primary) hypertension: Secondary | ICD-10-CM | POA: Diagnosis not present

## 2021-10-15 DIAGNOSIS — G4700 Insomnia, unspecified: Secondary | ICD-10-CM | POA: Diagnosis not present

## 2021-10-15 DIAGNOSIS — M170 Bilateral primary osteoarthritis of knee: Secondary | ICD-10-CM | POA: Diagnosis not present

## 2021-10-15 DIAGNOSIS — Z0001 Encounter for general adult medical examination with abnormal findings: Secondary | ICD-10-CM | POA: Diagnosis not present

## 2021-10-15 DIAGNOSIS — E559 Vitamin D deficiency, unspecified: Secondary | ICD-10-CM | POA: Diagnosis not present

## 2021-10-15 DIAGNOSIS — D509 Iron deficiency anemia, unspecified: Secondary | ICD-10-CM | POA: Diagnosis not present

## 2021-10-15 DIAGNOSIS — M5412 Radiculopathy, cervical region: Secondary | ICD-10-CM | POA: Diagnosis not present

## 2021-10-17 DIAGNOSIS — I779 Disorder of arteries and arterioles, unspecified: Secondary | ICD-10-CM | POA: Diagnosis not present

## 2021-10-30 DIAGNOSIS — Z1231 Encounter for screening mammogram for malignant neoplasm of breast: Secondary | ICD-10-CM | POA: Diagnosis not present

## 2021-11-25 IMAGING — US US THYROID
1 series · 14 of 25 positions shown · non-contrast
Comparison: 06/22/2019

CLINICAL DATA: Remote right thyroidectomy, thyroid goiter

EXAM:
THYROID ULTRASOUND
TECHNIQUE: Ultrasound examination of the thyroid gland and adjacent soft
tissues was performed.

[Series 1: us thyroid · 0.06mm/px · 14 of 28 slices shown]
[im 1/28]
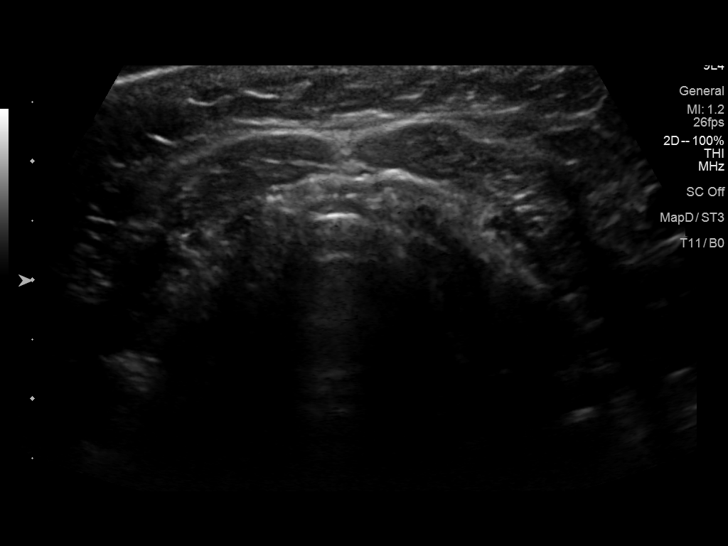
[im 3/28]
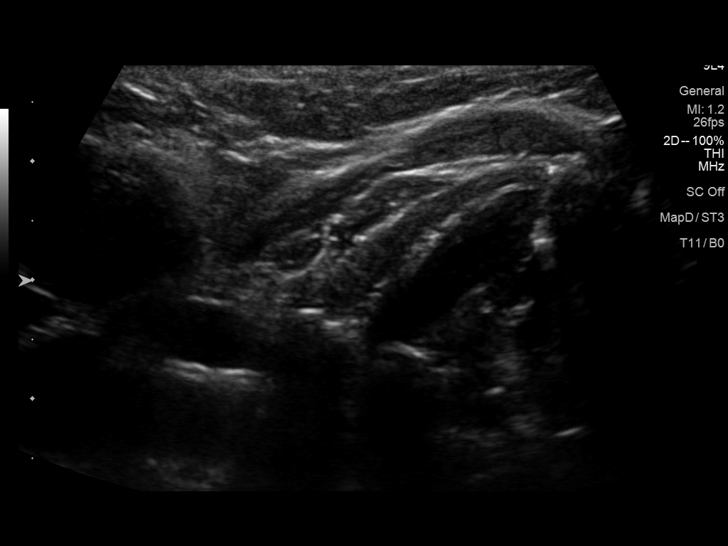
[im 5/28]
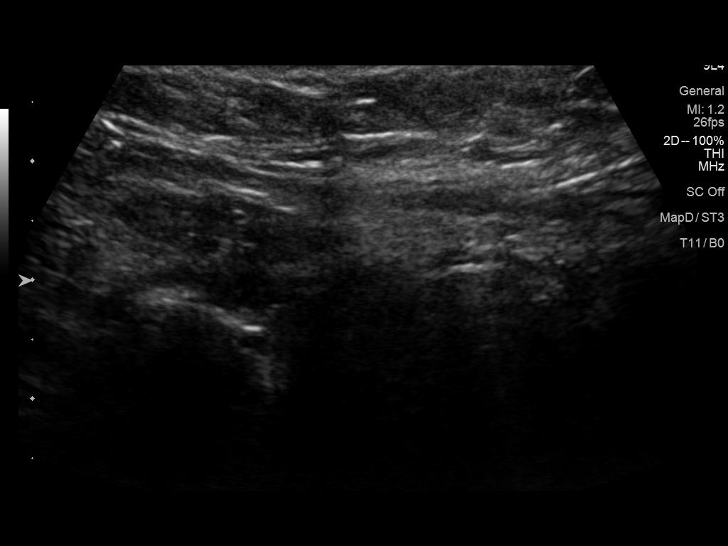
[im 7/28]
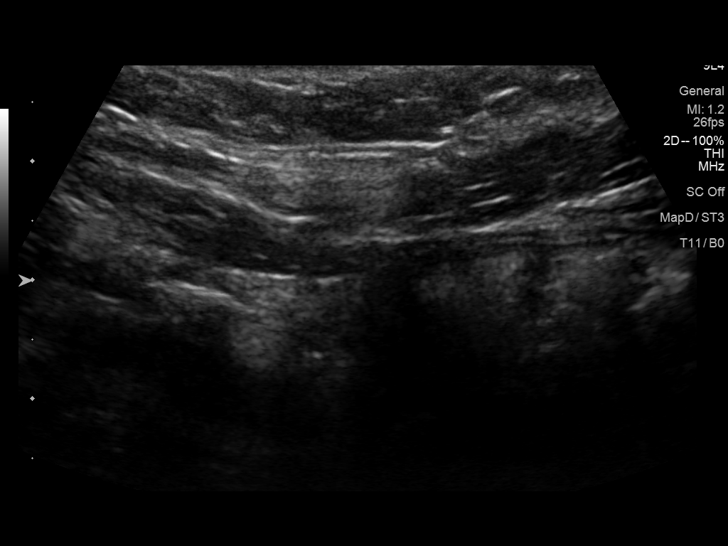
[im 10/28]
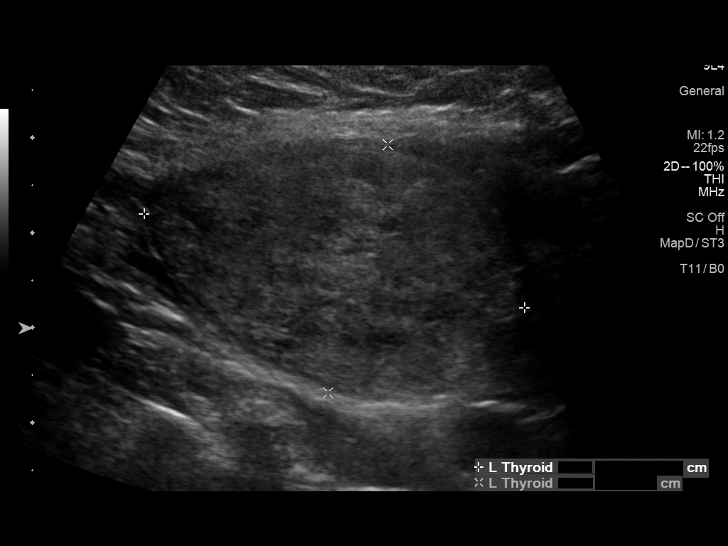
[im 11/28]
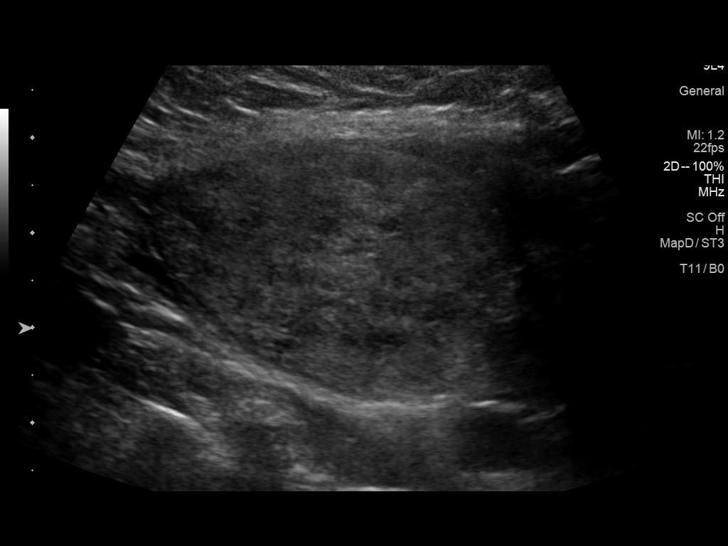
[im 13/28]
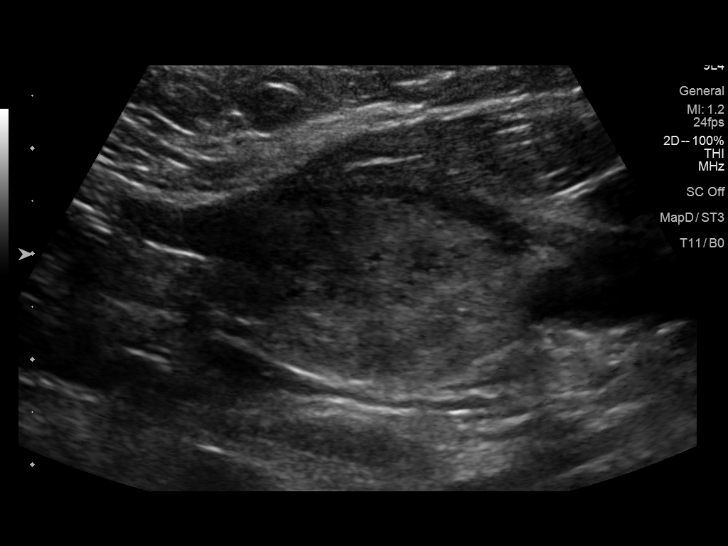
[im 15/28]
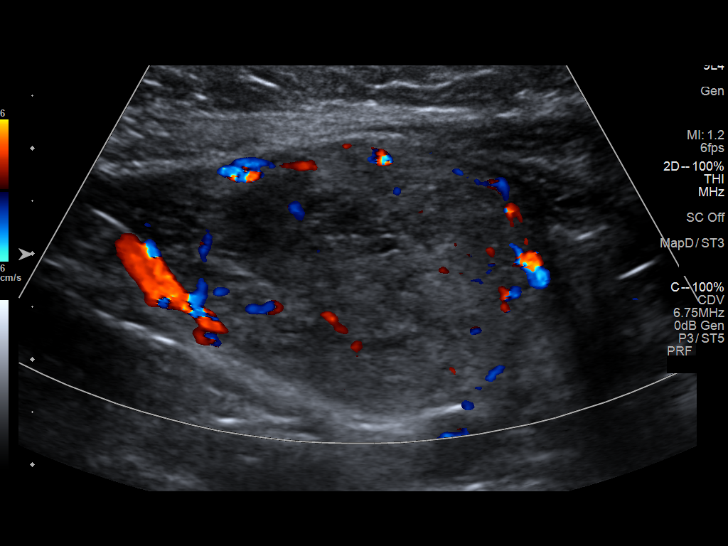
[im 17/28]
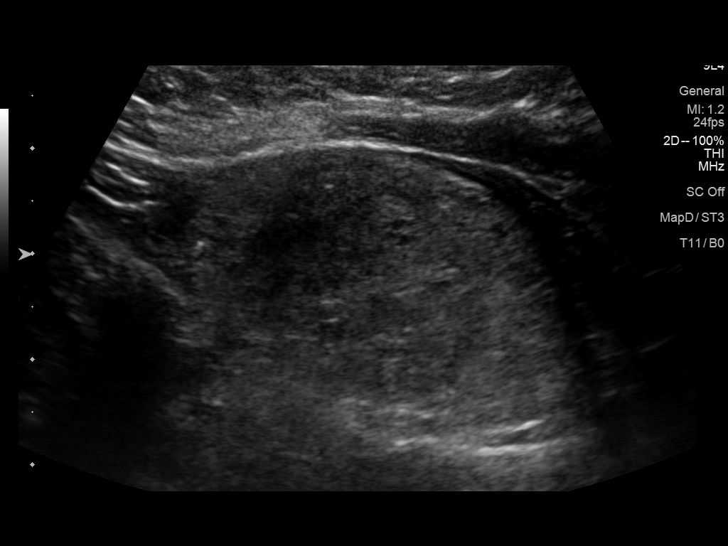
[im 19/28]
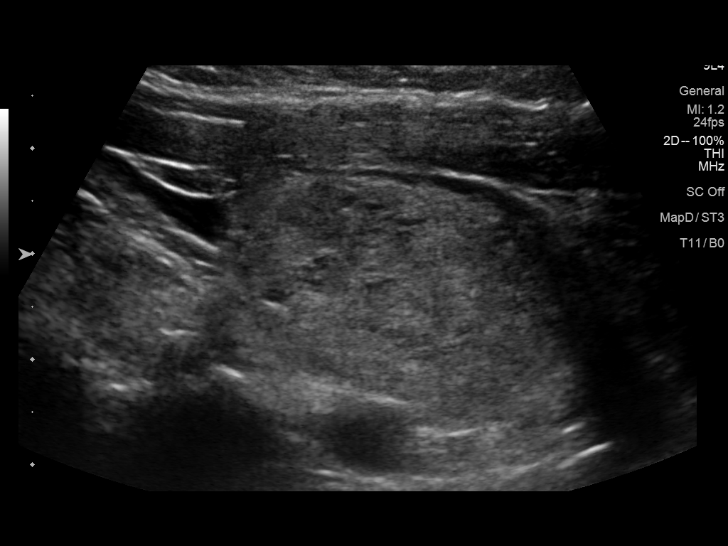
[im 21/28]
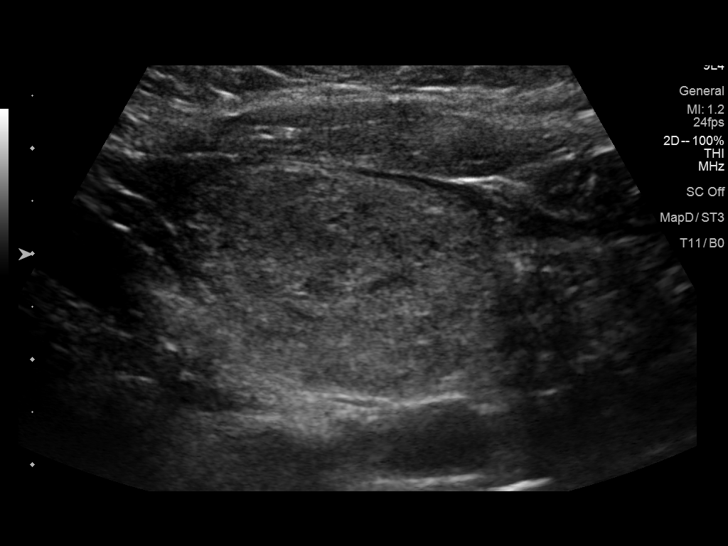
[im 23/28]
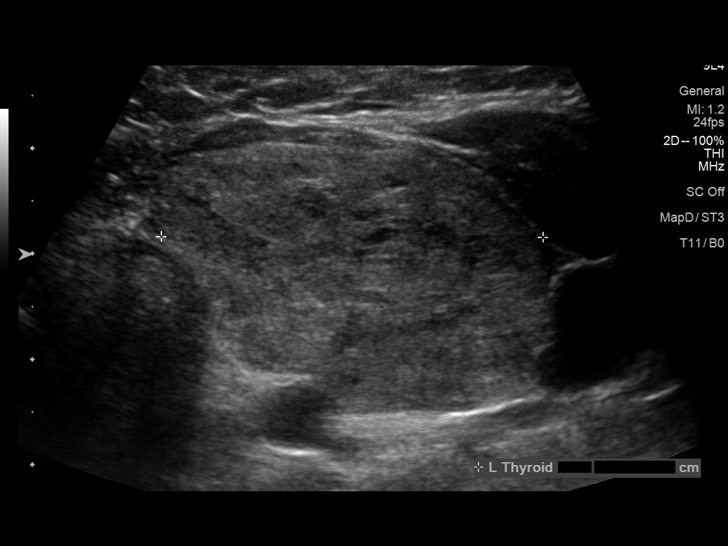
[im 25/28]
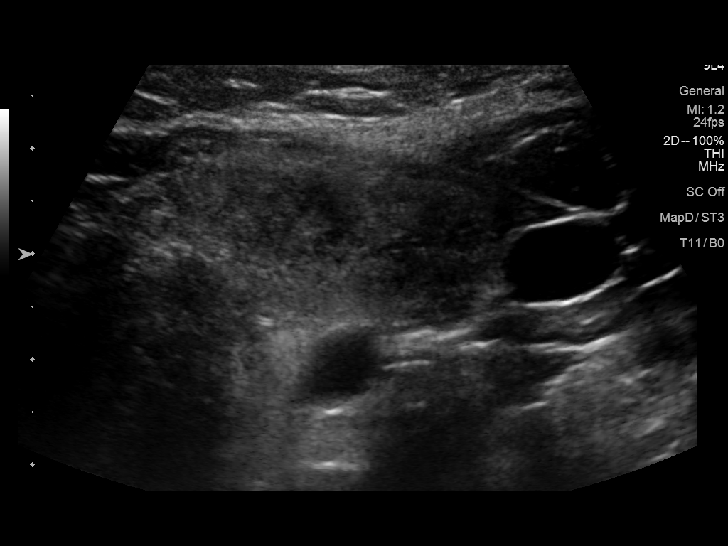
[im 28/28]
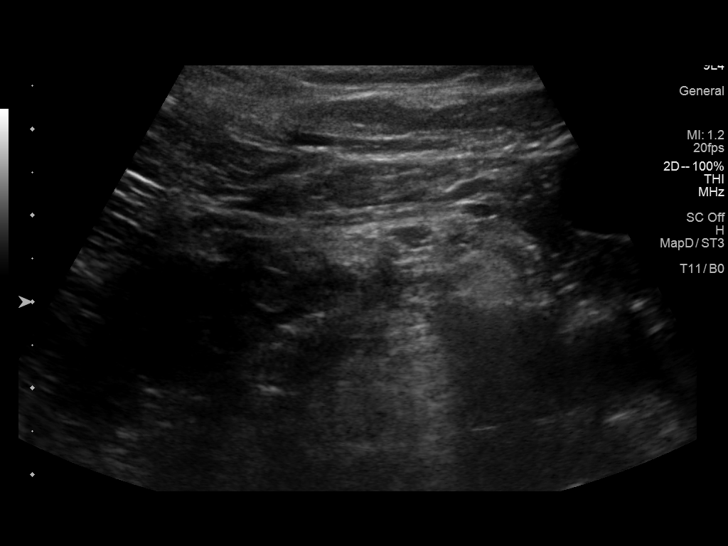

[14 of 25 positions shown; findings below may reference images not displayed]

FINDINGS: Parenchymal Echotexture: Markedly heterogenous

Isthmus: Surgically absent

Right lobe: Surgically absent

Left lobe: 4.1 x 2.7 x 3.6 cm

_________________________________________________________

Estimated total number of nodules >/= 1 cm: 0

Number of spongiform nodules >/=  2 cm not described below (TR1): 0

Number of mixed cystic and solid nodules >/= 1.5 cm not described
below (TR2): 0

_________________________________________________________

No right thyroid bed abnormality or residual thyroid tissue
appreciated. Negative for nodule.

Stable marked heterogeneous left thyroid lobe diffusely without
hypervascularity or developing nodule.

No new thyroid abnormality.  No regional adenopathy.
IMPRESSION: Remote right thyroidectomy.  No right thyroid bed abnormality.

Stable markedly heterogeneous left thyroid lobe without nodule.

The above is in keeping with the ACR TI-RADS recommendations - [HOSPITAL] 0052;[DATE].

## 2021-12-19 DIAGNOSIS — E039 Hypothyroidism, unspecified: Secondary | ICD-10-CM | POA: Diagnosis not present

## 2021-12-19 DIAGNOSIS — I129 Hypertensive chronic kidney disease with stage 1 through stage 4 chronic kidney disease, or unspecified chronic kidney disease: Secondary | ICD-10-CM | POA: Diagnosis not present

## 2021-12-19 DIAGNOSIS — N1831 Chronic kidney disease, stage 3a: Secondary | ICD-10-CM | POA: Diagnosis not present

## 2021-12-19 DIAGNOSIS — I5032 Chronic diastolic (congestive) heart failure: Secondary | ICD-10-CM | POA: Diagnosis not present

## 2022-01-14 DIAGNOSIS — Z86010 Personal history of colonic polyps: Secondary | ICD-10-CM | POA: Diagnosis not present

## 2022-01-14 DIAGNOSIS — K219 Gastro-esophageal reflux disease without esophagitis: Secondary | ICD-10-CM | POA: Diagnosis not present

## 2022-03-19 IMAGING — DX DG KNEE STANDING AP BILAT
1 series · 1 of 1 positions shown · non-contrast
Comparison: None.

CLINICAL DATA: Pain left knee

EXAM:
BILATERAL KNEES STANDING - 1 VIEW

[dg knee bilateral standing ap]
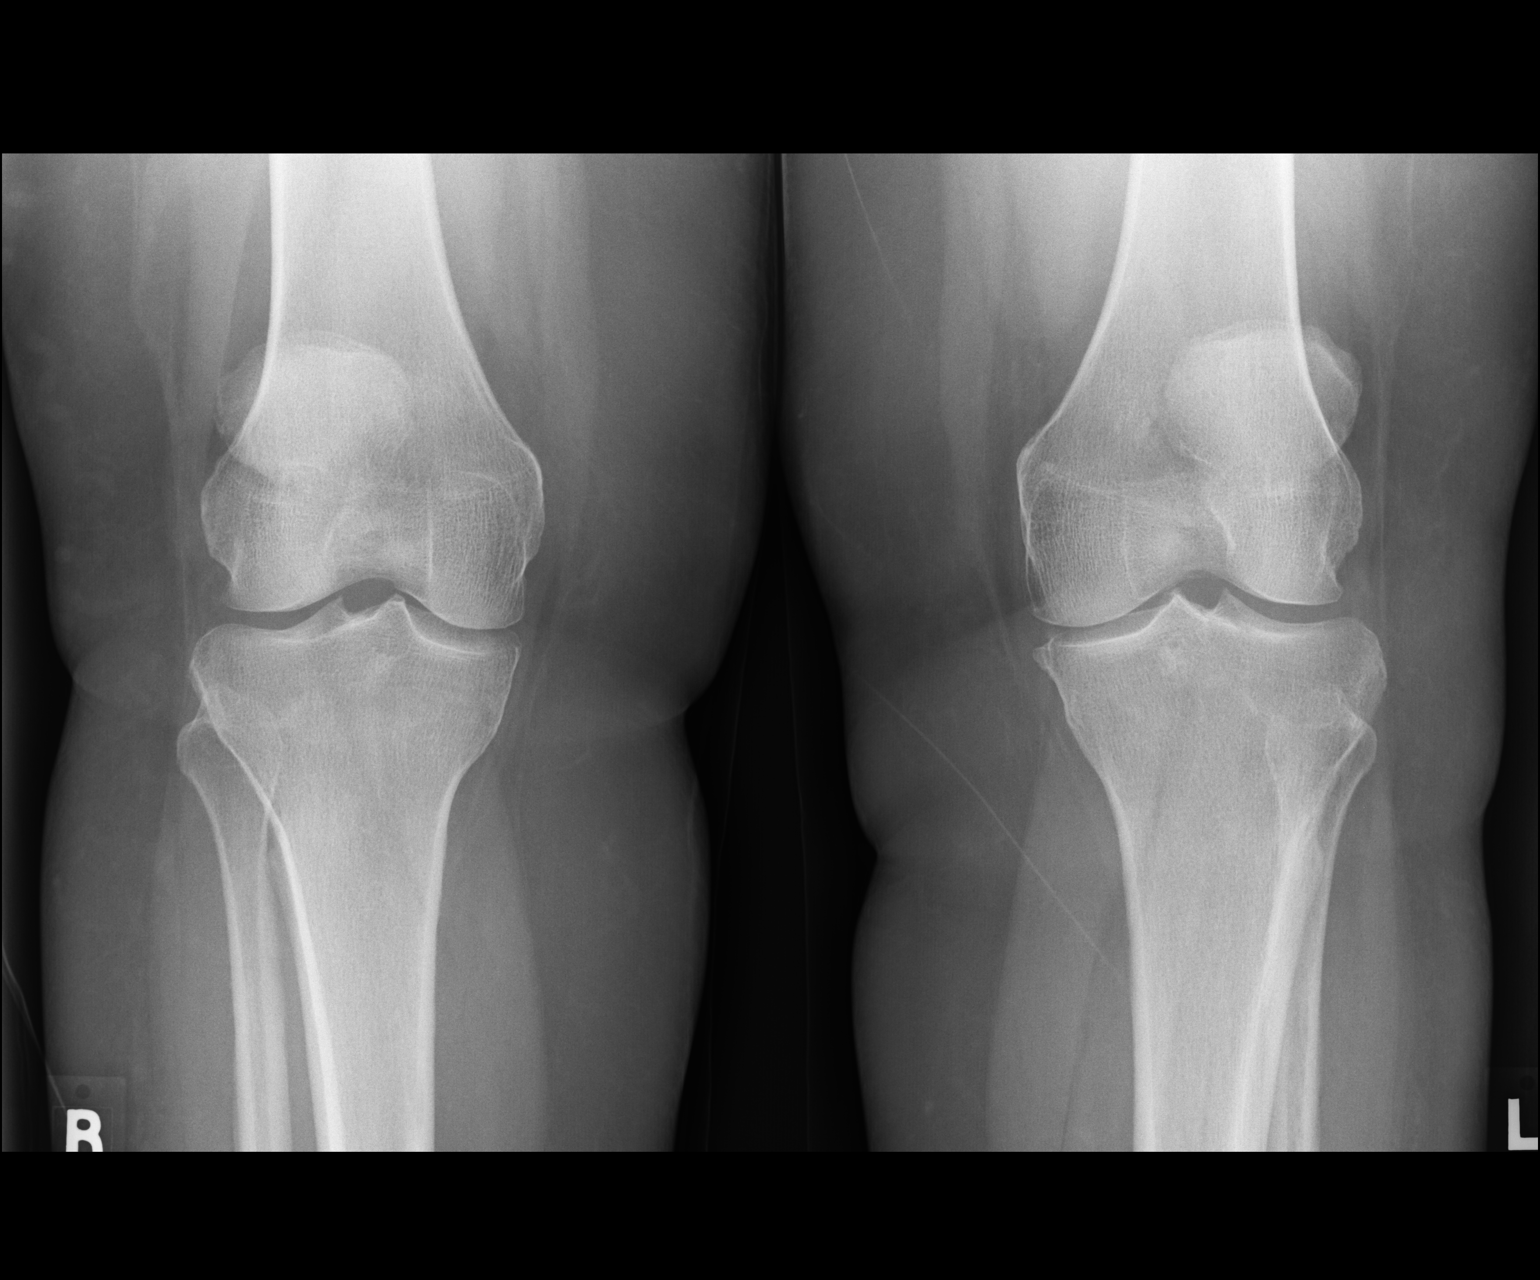

[1 of 1 positions shown; findings below may reference images not displayed]

FINDINGS: No fracture is seen. Degenerative changes are noted with joint space
narrowing and bony spurs in the medial compartment.
IMPRESSION: Limited AP views of both knees show degenerative changes in the
medial compartment in the left knee.

## 2022-05-05 DIAGNOSIS — K5730 Diverticulosis of large intestine without perforation or abscess without bleeding: Secondary | ICD-10-CM | POA: Diagnosis not present

## 2022-05-05 DIAGNOSIS — K635 Polyp of colon: Secondary | ICD-10-CM | POA: Diagnosis not present

## 2022-05-05 DIAGNOSIS — K621 Rectal polyp: Secondary | ICD-10-CM | POA: Diagnosis not present

## 2022-05-05 DIAGNOSIS — K317 Polyp of stomach and duodenum: Secondary | ICD-10-CM | POA: Diagnosis not present

## 2022-05-05 DIAGNOSIS — Z09 Encounter for follow-up examination after completed treatment for conditions other than malignant neoplasm: Secondary | ICD-10-CM | POA: Diagnosis not present

## 2022-05-05 DIAGNOSIS — K6389 Other specified diseases of intestine: Secondary | ICD-10-CM | POA: Diagnosis not present

## 2022-05-05 DIAGNOSIS — R12 Heartburn: Secondary | ICD-10-CM | POA: Diagnosis not present

## 2022-05-05 DIAGNOSIS — Z86010 Personal history of colonic polyps: Secondary | ICD-10-CM | POA: Diagnosis not present

## 2022-05-05 DIAGNOSIS — K648 Other hemorrhoids: Secondary | ICD-10-CM | POA: Diagnosis not present

## 2022-05-07 DIAGNOSIS — K635 Polyp of colon: Secondary | ICD-10-CM | POA: Diagnosis not present

## 2022-05-07 DIAGNOSIS — K621 Rectal polyp: Secondary | ICD-10-CM | POA: Diagnosis not present

## 2022-05-07 DIAGNOSIS — K317 Polyp of stomach and duodenum: Secondary | ICD-10-CM | POA: Diagnosis not present

## 2022-05-27 DIAGNOSIS — Z01419 Encounter for gynecological examination (general) (routine) without abnormal findings: Secondary | ICD-10-CM | POA: Diagnosis not present

## 2022-05-27 DIAGNOSIS — Z90710 Acquired absence of both cervix and uterus: Secondary | ICD-10-CM | POA: Diagnosis not present

## 2022-05-27 DIAGNOSIS — E039 Hypothyroidism, unspecified: Secondary | ICD-10-CM | POA: Diagnosis not present

## 2022-05-27 DIAGNOSIS — I10 Essential (primary) hypertension: Secondary | ICD-10-CM | POA: Diagnosis not present

## 2022-06-03 DIAGNOSIS — E890 Postprocedural hypothyroidism: Secondary | ICD-10-CM | POA: Diagnosis not present

## 2022-06-10 DIAGNOSIS — E049 Nontoxic goiter, unspecified: Secondary | ICD-10-CM | POA: Diagnosis not present

## 2022-06-10 DIAGNOSIS — I10 Essential (primary) hypertension: Secondary | ICD-10-CM | POA: Diagnosis not present

## 2022-06-10 DIAGNOSIS — E890 Postprocedural hypothyroidism: Secondary | ICD-10-CM | POA: Diagnosis not present

## 2022-06-22 DIAGNOSIS — Z23 Encounter for immunization: Secondary | ICD-10-CM | POA: Diagnosis not present

## 2022-09-24 DIAGNOSIS — E875 Hyperkalemia: Secondary | ICD-10-CM | POA: Diagnosis not present

## 2022-09-24 DIAGNOSIS — I5032 Chronic diastolic (congestive) heart failure: Secondary | ICD-10-CM | POA: Diagnosis not present

## 2022-09-24 DIAGNOSIS — I1A0 Resistant hypertension: Secondary | ICD-10-CM | POA: Diagnosis not present

## 2022-09-24 DIAGNOSIS — I130 Hypertensive heart and chronic kidney disease with heart failure and stage 1 through stage 4 chronic kidney disease, or unspecified chronic kidney disease: Secondary | ICD-10-CM | POA: Diagnosis not present

## 2022-09-24 DIAGNOSIS — N390 Urinary tract infection, site not specified: Secondary | ICD-10-CM | POA: Diagnosis not present

## 2022-09-24 DIAGNOSIS — N1831 Chronic kidney disease, stage 3a: Secondary | ICD-10-CM | POA: Diagnosis not present

## 2022-09-24 DIAGNOSIS — E039 Hypothyroidism, unspecified: Secondary | ICD-10-CM | POA: Diagnosis not present

## 2022-11-19 DIAGNOSIS — Z1231 Encounter for screening mammogram for malignant neoplasm of breast: Secondary | ICD-10-CM | POA: Diagnosis not present

## 2022-12-02 DIAGNOSIS — Z1331 Encounter for screening for depression: Secondary | ICD-10-CM | POA: Diagnosis not present

## 2022-12-02 DIAGNOSIS — E7800 Pure hypercholesterolemia, unspecified: Secondary | ICD-10-CM | POA: Diagnosis not present

## 2022-12-02 DIAGNOSIS — G4700 Insomnia, unspecified: Secondary | ICD-10-CM | POA: Diagnosis not present

## 2022-12-02 DIAGNOSIS — I10 Essential (primary) hypertension: Secondary | ICD-10-CM | POA: Diagnosis not present

## 2022-12-02 DIAGNOSIS — N1831 Chronic kidney disease, stage 3a: Secondary | ICD-10-CM | POA: Diagnosis not present

## 2022-12-02 DIAGNOSIS — K219 Gastro-esophageal reflux disease without esophagitis: Secondary | ICD-10-CM | POA: Diagnosis not present

## 2022-12-02 DIAGNOSIS — Z0001 Encounter for general adult medical examination with abnormal findings: Secondary | ICD-10-CM | POA: Diagnosis not present

## 2022-12-02 DIAGNOSIS — R739 Hyperglycemia, unspecified: Secondary | ICD-10-CM | POA: Diagnosis not present

## 2022-12-02 DIAGNOSIS — E039 Hypothyroidism, unspecified: Secondary | ICD-10-CM | POA: Diagnosis not present

## 2022-12-02 DIAGNOSIS — E559 Vitamin D deficiency, unspecified: Secondary | ICD-10-CM | POA: Diagnosis not present

## 2022-12-02 DIAGNOSIS — Z0000 Encounter for general adult medical examination without abnormal findings: Secondary | ICD-10-CM | POA: Diagnosis not present

## 2023-03-31 DIAGNOSIS — E875 Hyperkalemia: Secondary | ICD-10-CM | POA: Diagnosis not present

## 2023-03-31 DIAGNOSIS — I1A0 Resistant hypertension: Secondary | ICD-10-CM | POA: Diagnosis not present

## 2023-03-31 DIAGNOSIS — I130 Hypertensive heart and chronic kidney disease with heart failure and stage 1 through stage 4 chronic kidney disease, or unspecified chronic kidney disease: Secondary | ICD-10-CM | POA: Diagnosis not present

## 2023-03-31 DIAGNOSIS — I5032 Chronic diastolic (congestive) heart failure: Secondary | ICD-10-CM | POA: Diagnosis not present

## 2023-03-31 DIAGNOSIS — N2581 Secondary hyperparathyroidism of renal origin: Secondary | ICD-10-CM | POA: Diagnosis not present

## 2023-03-31 DIAGNOSIS — N1831 Chronic kidney disease, stage 3a: Secondary | ICD-10-CM | POA: Diagnosis not present

## 2023-06-02 DIAGNOSIS — Z23 Encounter for immunization: Secondary | ICD-10-CM | POA: Diagnosis not present

## 2023-06-02 DIAGNOSIS — I509 Heart failure, unspecified: Secondary | ICD-10-CM | POA: Diagnosis not present

## 2023-06-02 DIAGNOSIS — I130 Hypertensive heart and chronic kidney disease with heart failure and stage 1 through stage 4 chronic kidney disease, or unspecified chronic kidney disease: Secondary | ICD-10-CM | POA: Diagnosis not present

## 2023-06-02 DIAGNOSIS — K219 Gastro-esophageal reflux disease without esophagitis: Secondary | ICD-10-CM | POA: Diagnosis not present

## 2023-06-02 DIAGNOSIS — N1831 Chronic kidney disease, stage 3a: Secondary | ICD-10-CM | POA: Diagnosis not present

## 2023-06-02 DIAGNOSIS — M5412 Radiculopathy, cervical region: Secondary | ICD-10-CM | POA: Diagnosis not present

## 2023-06-10 DIAGNOSIS — E049 Nontoxic goiter, unspecified: Secondary | ICD-10-CM | POA: Diagnosis not present

## 2023-06-10 DIAGNOSIS — E890 Postprocedural hypothyroidism: Secondary | ICD-10-CM | POA: Diagnosis not present

## 2023-06-10 DIAGNOSIS — E538 Deficiency of other specified B group vitamins: Secondary | ICD-10-CM | POA: Diagnosis not present

## 2023-06-10 DIAGNOSIS — M79601 Pain in right arm: Secondary | ICD-10-CM | POA: Diagnosis not present

## 2023-06-10 DIAGNOSIS — M542 Cervicalgia: Secondary | ICD-10-CM | POA: Diagnosis not present

## 2023-06-10 DIAGNOSIS — I10 Essential (primary) hypertension: Secondary | ICD-10-CM | POA: Diagnosis not present

## 2023-06-10 DIAGNOSIS — E559 Vitamin D deficiency, unspecified: Secondary | ICD-10-CM | POA: Diagnosis not present

## 2023-06-15 DIAGNOSIS — M79601 Pain in right arm: Secondary | ICD-10-CM | POA: Diagnosis not present

## 2023-06-15 DIAGNOSIS — M542 Cervicalgia: Secondary | ICD-10-CM | POA: Diagnosis not present

## 2023-06-17 DIAGNOSIS — E559 Vitamin D deficiency, unspecified: Secondary | ICD-10-CM | POA: Diagnosis not present

## 2023-06-17 DIAGNOSIS — E890 Postprocedural hypothyroidism: Secondary | ICD-10-CM | POA: Diagnosis not present

## 2023-06-17 DIAGNOSIS — I10 Essential (primary) hypertension: Secondary | ICD-10-CM | POA: Diagnosis not present

## 2023-06-17 DIAGNOSIS — E049 Nontoxic goiter, unspecified: Secondary | ICD-10-CM | POA: Diagnosis not present

## 2023-06-22 DIAGNOSIS — M79601 Pain in right arm: Secondary | ICD-10-CM | POA: Diagnosis not present

## 2023-06-22 DIAGNOSIS — M542 Cervicalgia: Secondary | ICD-10-CM | POA: Diagnosis not present

## 2023-06-23 DIAGNOSIS — M542 Cervicalgia: Secondary | ICD-10-CM | POA: Diagnosis not present

## 2023-06-23 DIAGNOSIS — M79601 Pain in right arm: Secondary | ICD-10-CM | POA: Diagnosis not present

## 2023-06-29 DIAGNOSIS — M542 Cervicalgia: Secondary | ICD-10-CM | POA: Diagnosis not present

## 2023-06-29 DIAGNOSIS — M79601 Pain in right arm: Secondary | ICD-10-CM | POA: Diagnosis not present

## 2023-07-01 DIAGNOSIS — M542 Cervicalgia: Secondary | ICD-10-CM | POA: Diagnosis not present

## 2023-07-01 DIAGNOSIS — M79601 Pain in right arm: Secondary | ICD-10-CM | POA: Diagnosis not present

## 2023-07-06 DIAGNOSIS — M542 Cervicalgia: Secondary | ICD-10-CM | POA: Diagnosis not present

## 2023-07-06 DIAGNOSIS — M79601 Pain in right arm: Secondary | ICD-10-CM | POA: Diagnosis not present

## 2023-07-13 DIAGNOSIS — M79601 Pain in right arm: Secondary | ICD-10-CM | POA: Diagnosis not present

## 2023-07-13 DIAGNOSIS — M542 Cervicalgia: Secondary | ICD-10-CM | POA: Diagnosis not present

## 2023-07-15 DIAGNOSIS — M542 Cervicalgia: Secondary | ICD-10-CM | POA: Diagnosis not present

## 2023-07-15 DIAGNOSIS — M79601 Pain in right arm: Secondary | ICD-10-CM | POA: Diagnosis not present

## 2023-07-20 DIAGNOSIS — M542 Cervicalgia: Secondary | ICD-10-CM | POA: Diagnosis not present

## 2023-07-20 DIAGNOSIS — M79601 Pain in right arm: Secondary | ICD-10-CM | POA: Diagnosis not present

## 2023-07-22 DIAGNOSIS — M542 Cervicalgia: Secondary | ICD-10-CM | POA: Diagnosis not present

## 2023-07-22 DIAGNOSIS — M79601 Pain in right arm: Secondary | ICD-10-CM | POA: Diagnosis not present

## 2023-07-27 DIAGNOSIS — M542 Cervicalgia: Secondary | ICD-10-CM | POA: Diagnosis not present

## 2023-07-27 DIAGNOSIS — M79601 Pain in right arm: Secondary | ICD-10-CM | POA: Diagnosis not present

## 2023-07-28 DIAGNOSIS — M79601 Pain in right arm: Secondary | ICD-10-CM | POA: Diagnosis not present

## 2023-07-28 DIAGNOSIS — M542 Cervicalgia: Secondary | ICD-10-CM | POA: Diagnosis not present

## 2023-08-10 DIAGNOSIS — H2513 Age-related nuclear cataract, bilateral: Secondary | ICD-10-CM | POA: Diagnosis not present

## 2023-10-05 DIAGNOSIS — E875 Hyperkalemia: Secondary | ICD-10-CM | POA: Diagnosis not present

## 2023-10-05 DIAGNOSIS — D509 Iron deficiency anemia, unspecified: Secondary | ICD-10-CM | POA: Diagnosis not present

## 2023-10-05 DIAGNOSIS — N1831 Chronic kidney disease, stage 3a: Secondary | ICD-10-CM | POA: Diagnosis not present

## 2023-10-05 DIAGNOSIS — E039 Hypothyroidism, unspecified: Secondary | ICD-10-CM | POA: Diagnosis not present

## 2023-10-05 DIAGNOSIS — I5032 Chronic diastolic (congestive) heart failure: Secondary | ICD-10-CM | POA: Diagnosis not present

## 2023-12-03 DIAGNOSIS — Z1231 Encounter for screening mammogram for malignant neoplasm of breast: Secondary | ICD-10-CM | POA: Diagnosis not present

## 2023-12-20 DIAGNOSIS — Z23 Encounter for immunization: Secondary | ICD-10-CM | POA: Diagnosis not present

## 2023-12-20 DIAGNOSIS — K219 Gastro-esophageal reflux disease without esophagitis: Secondary | ICD-10-CM | POA: Diagnosis not present

## 2023-12-20 DIAGNOSIS — E039 Hypothyroidism, unspecified: Secondary | ICD-10-CM | POA: Diagnosis not present

## 2023-12-20 DIAGNOSIS — R7303 Prediabetes: Secondary | ICD-10-CM | POA: Diagnosis not present

## 2023-12-20 DIAGNOSIS — I130 Hypertensive heart and chronic kidney disease with heart failure and stage 1 through stage 4 chronic kidney disease, or unspecified chronic kidney disease: Secondary | ICD-10-CM | POA: Diagnosis not present

## 2023-12-20 DIAGNOSIS — Z1331 Encounter for screening for depression: Secondary | ICD-10-CM | POA: Diagnosis not present

## 2023-12-20 DIAGNOSIS — Z79899 Other long term (current) drug therapy: Secondary | ICD-10-CM | POA: Diagnosis not present

## 2023-12-20 DIAGNOSIS — R7989 Other specified abnormal findings of blood chemistry: Secondary | ICD-10-CM | POA: Diagnosis not present

## 2023-12-20 DIAGNOSIS — G4700 Insomnia, unspecified: Secondary | ICD-10-CM | POA: Diagnosis not present

## 2023-12-20 DIAGNOSIS — Z0000 Encounter for general adult medical examination without abnormal findings: Secondary | ICD-10-CM | POA: Diagnosis not present

## 2023-12-20 DIAGNOSIS — E7800 Pure hypercholesterolemia, unspecified: Secondary | ICD-10-CM | POA: Diagnosis not present

## 2023-12-20 DIAGNOSIS — R799 Abnormal finding of blood chemistry, unspecified: Secondary | ICD-10-CM | POA: Diagnosis not present

## 2023-12-20 DIAGNOSIS — G43909 Migraine, unspecified, not intractable, without status migrainosus: Secondary | ICD-10-CM | POA: Diagnosis not present

## 2023-12-20 DIAGNOSIS — R945 Abnormal results of liver function studies: Secondary | ICD-10-CM | POA: Diagnosis not present

## 2023-12-20 DIAGNOSIS — E559 Vitamin D deficiency, unspecified: Secondary | ICD-10-CM | POA: Diagnosis not present

## 2024-04-04 DIAGNOSIS — I5032 Chronic diastolic (congestive) heart failure: Secondary | ICD-10-CM | POA: Diagnosis not present

## 2024-04-04 DIAGNOSIS — N2581 Secondary hyperparathyroidism of renal origin: Secondary | ICD-10-CM | POA: Diagnosis not present

## 2024-04-04 DIAGNOSIS — N1831 Chronic kidney disease, stage 3a: Secondary | ICD-10-CM | POA: Diagnosis not present

## 2024-04-04 DIAGNOSIS — I130 Hypertensive heart and chronic kidney disease with heart failure and stage 1 through stage 4 chronic kidney disease, or unspecified chronic kidney disease: Secondary | ICD-10-CM | POA: Diagnosis not present

## 2024-04-04 DIAGNOSIS — I1A0 Resistant hypertension: Secondary | ICD-10-CM | POA: Diagnosis not present

## 2024-04-04 DIAGNOSIS — D509 Iron deficiency anemia, unspecified: Secondary | ICD-10-CM | POA: Diagnosis not present

## 2024-05-23 ENCOUNTER — Inpatient Hospital Stay

## 2024-05-23 ENCOUNTER — Inpatient Hospital Stay: Attending: Oncology | Admitting: Oncology

## 2024-05-23 ENCOUNTER — Encounter: Payer: Self-pay | Admitting: Oncology

## 2024-05-23 VITALS — BP 148/66 | HR 71 | Temp 98.4°F | Resp 18 | Ht 62.0 in | Wt 219.9 lb

## 2024-05-23 DIAGNOSIS — R7989 Other specified abnormal findings of blood chemistry: Secondary | ICD-10-CM | POA: Diagnosis not present

## 2024-05-23 LAB — CMP (CANCER CENTER ONLY)
ALT: 9 U/L (ref 0–44)
AST: 23 U/L (ref 15–41)
Albumin: 4.4 g/dL (ref 3.5–5.0)
Alkaline Phosphatase: 104 U/L (ref 38–126)
Anion gap: 9 (ref 5–15)
BUN: 26 mg/dL — ABNORMAL HIGH (ref 8–23)
CO2: 24 mmol/L (ref 22–32)
Calcium: 10.5 mg/dL — ABNORMAL HIGH (ref 8.9–10.3)
Chloride: 101 mmol/L (ref 98–111)
Creatinine: 1.22 mg/dL — ABNORMAL HIGH (ref 0.44–1.00)
GFR, Estimated: 48 mL/min — ABNORMAL LOW (ref >60)
Glucose, Bld: 109 mg/dL — ABNORMAL HIGH (ref 70–99)
Potassium: 4.4 mmol/L (ref 3.5–5.1)
Sodium: 134 mmol/L — ABNORMAL LOW (ref 135–145)
Total Bilirubin: 0.2 mg/dL (ref 0.0–1.2)
Total Protein: 7.4 g/dL (ref 6.5–8.1)

## 2024-05-23 LAB — C-REACTIVE PROTEIN: CRP: 0.6 mg/dL (ref <1.0)

## 2024-05-23 LAB — CBC WITH DIFFERENTIAL (CANCER CENTER ONLY)
Abs Immature Granulocytes: 0.02 K/uL (ref 0.00–0.07)
Basophils Absolute: 0 K/uL (ref 0.0–0.1)
Basophils Relative: 0 %
Eosinophils Absolute: 0 K/uL (ref 0.0–0.5)
Eosinophils Relative: 1 %
HCT: 38.2 % (ref 36.0–46.0)
Hemoglobin: 13 g/dL (ref 12.0–15.0)
Immature Granulocytes: 1 %
Lymphocytes Relative: 25 %
Lymphs Abs: 1.1 K/uL (ref 0.7–4.0)
MCH: 32 pg (ref 26.0–34.0)
MCHC: 34 g/dL (ref 30.0–36.0)
MCV: 94.1 fL (ref 80.0–100.0)
Monocytes Absolute: 0.5 K/uL (ref 0.1–1.0)
Monocytes Relative: 11 %
Neutro Abs: 2.8 K/uL (ref 1.7–7.7)
Neutrophils Relative %: 62 %
Platelet Count: 207 K/uL (ref 150–400)
RBC: 4.06 MIL/uL (ref 3.87–5.11)
RDW: 13.2 % (ref 11.5–15.5)
WBC Count: 4.4 K/uL (ref 4.0–10.5)
nRBC: 0 % (ref 0.0–0.2)

## 2024-05-23 LAB — IRON AND TIBC
Iron: 83 ug/dL (ref 28–170)
Saturation Ratios: 29 % (ref 10.4–31.8)
TIBC: 286 ug/dL (ref 250–450)
UIBC: 203 ug/dL

## 2024-05-23 LAB — SEDIMENTATION RATE: Sed Rate: 26 mm/h — ABNORMAL HIGH (ref 0–22)

## 2024-05-23 LAB — FERRITIN: Ferritin: 1508 ng/mL — ABNORMAL HIGH (ref 11–307)

## 2024-05-23 NOTE — Assessment & Plan Note (Addendum)
 Chronic elevated ferritin levels, previously 1800 ng/mL, now 1400 ng/mL. No anemia. Differential includes hemochromatosis or inflammation-related elevation.  Discussed hemochromatosis, characterized by increased iron absorption leading to organ damage, and ferritin as an inflammatory marker.  Explained genetic basis, particularly C282Y mutation, and treatment with phlebotomy.   Discussed dietary modifications to limit iron intake and vitamin C's role in iron absorption. Consider iron chelation if phlebotomy is ineffective.  Labs today showed persistently increased ferritin of 1508.  CBCD is grossly unremarkable.  LFTs are within normal limits.  Creatinine 1.22.  Iron studies otherwise are unremarkable.  CRP normal at 0.6, sed rate increased at 26 mm/h.  - Order genetic testing for hemochromatosis mutations, including C282Y, H63D, S65C mutations.  ANA checked to rule out any underlying autoimmune disorders.  - Advise moderation of iron-rich foods and avoid concurrent intake of vitamin C with iron-rich foods.  - Schedule a phone call in two weeks to discuss test results.  - Plan follow-up visit in six weeks for repeat labs and further management based on test results.

## 2024-05-23 NOTE — Progress Notes (Signed)
 [facility]  HEMATOLOGY CLINIC CONSULTATION NOTE   PATIENT NAME: Tonya Sims   MR#: [id number] DOB: 11/19/1955  DATE OF SERVICE: 05/23/2024  Patient Care Team: Delice Charleston, MD (Inactive) as PCP - General (Internal Medicine) Gearline Norris, MD as Consulting Physician (Nephrology)  REASON FOR CONSULTATION/ CHIEF COMPLAINT:  Elevated ferritin.  ASSESSMENT & PLAN:   Tonya Sims is a 68 y.o. lady with a past medical history of hypertension, dyslipidemia, GERD, CKD stage III, history of 2 left renal arteries, chronic diastolic congestive heart failure, hypothyroidism, was referred to our service for evaluation of elevated ferritin.   Elevated ferritin level Chronic elevated ferritin levels, previously 1800 ng/mL, now 1400 ng/mL. No anemia. Differential includes hemochromatosis or inflammation-related elevation.  Discussed hemochromatosis, characterized by increased iron absorption leading to organ damage, and ferritin as an inflammatory marker.  Explained genetic basis, particularly C282Y mutation, and treatment with phlebotomy.   Discussed dietary modifications to limit iron intake and vitamin C's role in iron absorption. Consider iron chelation if phlebotomy is ineffective.  Labs today showed persistently increased ferritin of 1508.  CBCD is grossly unremarkable.  LFTs are within normal limits.  Creatinine 1.22.  Iron studies otherwise are unremarkable.  CRP normal at 0.6, sed rate increased at 26 mm/h.  - Order genetic testing for hemochromatosis mutations, including C282Y, H63D, S65C mutations.  ANA checked to rule out any underlying autoimmune disorders.  - Advise moderation of iron-rich foods and avoid concurrent intake of vitamin C with iron-rich foods.  - Schedule a phone call in two weeks to discuss test results.  - Plan follow-up visit in six weeks for repeat labs and further management based on test results.    I reviewed lab results  and outside records for this visit and discussed relevant results with the patient. Diagnosis, plan of care and treatment options were also discussed in detail with the patient. Opportunity provided to ask questions and answers provided to her apparent satisfaction. Provided instructions to call our clinic with any problems, questions or concerns prior to return visit. I recommended to continue follow-up with PCP and sub-specialists. She verbalized understanding and agreed with the plan. No barriers to learning was detected.  Vianna Venezia, MD [facility] - A DEPT OF [facility] [id number] [place] [address] Dept: [phone] Dept Fax: [phone]  05/23/2024 6:09 PM  HISTORY OF PRESENT ILLNESS:  Discussed the use of AI scribe software for clinical note transcription with the patient, who gave verbal consent to proceed.  History of Present Illness Tonya Sims is a 68 year old female who presents with iron metabolism issues. She was referred by Dr. Gearline.   On 04/04/2024, labs at her nephrologist office showed hemoglobin of 12.5, hematocrit 38.3, MCV 97.  White count and platelet count were within normal limits.  Ferritin was elevated at 1473, iron binding capacity was slightly decreased at 226, normal iron of 91, normal iron saturation of 40%.  Creatinine was 1.22, calcium 10.2.  She was referred to us  for further evaluation of elevated ferritin.   She has a history of elevated ferritin levels, with a peak of 1800 ng/mL in 2023-2024, which decreased to 1400 ng/mL after discontinuing iron supplements. She has a past history of anemia but currently does not have low red blood cell counts. She was previously taking iron supplements but has stopped due to high ferritin levels.  She underwent a partial thyroidectomy  over 20 years ago due to nodule growth on the right side of her thyroid . She currently has no  nodules.  She experiences chronic kidney disease and hypertension, which are being monitored. Her blood pressure is generally well-controlled, though it can increase with physical activity or stress. She monitors her blood pressure at home with her own device.  She has a history of gastrointestinal issues, including nausea and burning sensations in her stomach, which led to a colonoscopy and endoscopy in 2023. The procedures revealed multiple polyps in her stomach, which were removed.  She has undergone physical therapy for neuropathy and sciatica, attending sessions at night. She has a history of neck issues involving C3 and L vertebrae, managed by her neurologist, Dr. Joshua.  No chest pain, trouble breathing, or palpitations.     MEDICAL HISTORY:  Past Medical History:  Diagnosis Date   Anemia    Chronic pain    GERD (gastroesophageal reflux disease)    Goiter    left thryoid nodule   Headache    migraines    Heart murmur 2016   heard murmur, no murmur heard now   Hypertension    Hyperthyroidism since 2016   until 2016 hypothryoid    SURGICAL HISTORY: Past Surgical History:  Procedure Laterality Date    vein stripping Bilateral 2017   wears compression hose    ABDOMINAL HYSTERECTOMY  yrs ago   complete   COLONOSCOPY WITH PROPOFOL  N/A 11/24/2016   Procedure: COLONOSCOPY WITH PROPOFOL ;  Surgeon: Gladis MARLA Louder, MD;  Location: [facility];  Service: Endoscopy;  Laterality: N/A;   DILATION AND CURETTAGE OF UTERUS   39yrs ago   after miscarriage   right thryoid removed   yrs ago   tube removed from larnyx thay was intentionally left in to protect parathyroid after thryoid surgery remed after growing into mass  yrs ago   60 days after thryoid surgery    SOCIAL HISTORY: She reports that she has never smoked. She has never used smokeless tobacco. She reports that she does not drink alcohol and does not use drugs. Social History   Socioeconomic History   Marital status:  Married    Spouse name: Not on file   Number of children: Not on file   Years of education: Not on file   Highest education level: Not on file  Occupational History   Not on file  Tobacco Use   Smoking status: Never   Smokeless tobacco: Never  Substance and Sexual Activity   Alcohol use: No   Drug use: No   Sexual activity: Not on file  Other Topics Concern   Not on file  Social History Narrative   Not on file   Social Drivers of Health   Financial Resource Strain: Not on file  Food Insecurity: No Food Insecurity (05/23/2024)   Hunger Vital Sign    Worried About Running Out of Food in the Last Year: Never true    Ran Out of Food in the Last Year: Never true  Transportation Needs: No Transportation Needs (05/23/2024)   PRAPARE - Administrator, Civil Service (Medical): No    Lack of Transportation (Non-Medical): No  Physical Activity: Not on file  Stress: Not on file  Social Connections: Not on file  Intimate Partner Violence: Not At Risk (05/23/2024)   Humiliation, Afraid, Rape, and Kick questionnaire    Fear of Current or Ex-Partner: No    Emotionally Abused: No    Physically Abused: No  Sexually Abused: No    FAMILY HISTORY: No family history on file.  ALLERGIES:  She is allergic to d3 2000 [cholecalciferol], monopril [fosinopril], morphine and codeine, percocet [oxycodone-acetaminophen], and propoxyphene.  MEDICATIONS:  Current Outpatient Medications  Medication Sig Dispense Refill   acetaminophen (TYLENOL) 500 MG tablet Take 1,000 mg by mouth 3 (three) times daily as needed (for pain).     Ascorbic Acid (VITAMIN C) 1000 MG tablet Take 1,000 mg by mouth daily.     atenolol (TENORMIN) 50 MG tablet Take 50 mg by mouth 2 (two) times daily.  3   B Complex Vitamins (B COMPLEX 50 PO) Take 1 tablet by mouth daily.     butalbital-acetaminophen-caffeine (FIORICET, ESGIC) 50-325-40 MG tablet Take 1-2 tablets by mouth every 6 (six) hours as needed for  migraine or headache.  1   cloNIDine (CATAPRES) 0.1 MG tablet Take 0.1 mg by mouth at bedtime.  11   cloNIDine (CATAPRES) 0.3 MG tablet Take 0.3 mg by mouth daily.     Coenzyme Q10 (CO Q10) 100 MG CAPS Take 100 mg by mouth daily.     EVENING PRIMROSE OIL PO Take 500 mg by mouth daily.     famotidine (PEPCID) 10 MG tablet Take 10 mg by mouth 2 (two) times daily.     fexofenadine (ALLEGRA) 180 MG tablet Take 180 mg by mouth at bedtime.     fluticasone (FLONASE) 50 MCG/ACT nasal spray Place 1 spray into both nostrils 2 (two) times daily.  5   gabapentin (NEURONTIN) 300 MG capsule Take 300 mg by mouth at bedtime.  1   levothyroxine (SYNTHROID) 88 MCG tablet Take 88 mcg by mouth daily before breakfast.     levothyroxine (SYNTHROID, LEVOTHROID) 100 MCG tablet Take 100 mcg by mouth daily at 6 (six) AM.  11   losartan-hydrochlorothiazide (HYZAAR) 100-25 MG tablet Take 1 tablet by mouth daily.     magnesium gluconate (MAGONATE) 500 (27 Mg) MG TABS tablet Take 500 mg by mouth in the morning and at bedtime.     Multiple Vitamin (MULTIVITAMIN) tablet Take 1 tablet by mouth daily.     Naphazoline-Pheniramine (OPCON-A) 0.027-0.315 % SOLN Place 1 drop into both eyes daily.     Omega-3 Fatty Acids (OMEGA 3 PO) Take 2 capsules by mouth daily. 1150 mg per 2 capsules     omeprazole (PRILOSEC) 20 MG capsule Take 20 mg by mouth See admin instructions. Once every evening & if needed in the morning  3   PREMARIN 0.3 MG tablet Take 0.3 mg by mouth daily.  11   spironolactone (ALDACTONE) 50 MG tablet Take 50 mg by mouth daily.     zolpidem (AMBIEN) 5 MG tablet Take 5 mg by mouth at bedtime.  4   No current facility-administered medications for this visit.    REVIEW OF SYSTEMS:    Review of Systems - Oncology  All other pertinent systems were reviewed and were negative except as mentioned above.  PHYSICAL EXAMINATION:   Onc Performance Status - 05/23/24 1022       ECOG Perf Status   ECOG Perf Status  Restricted in physically strenuous activity but ambulatory and able to carry out work of a light or sedentary nature, e.g., light house work, office work      KPS SCALE   KPS % SCORE Normal activity with effort, some s/s of disease          Vitals:   05/23/24 1001  BP: (!) 148/66  Pulse: 71  Resp: 18  Temp: 98.4 F (36.9 C)  SpO2: 100%   Filed Weights   05/23/24 1001  Weight: 219 lb 14.4 oz (99.7 kg)    Physical Exam Constitutional:      General: She is not in acute distress.    Appearance: Normal appearance.  HENT:     Head: Normocephalic and atraumatic.  Cardiovascular:     Rate and Rhythm: Normal rate.     Heart sounds: Normal heart sounds.  Pulmonary:     Effort: Pulmonary effort is normal. No respiratory distress.     Breath sounds: Normal breath sounds.  Abdominal:     General: There is no distension.  Neurological:     General: No focal deficit present.     Mental Status: She is alert and oriented to person, place, and time.  Psychiatric:        Mood and Affect: Mood normal.        Behavior: Behavior normal.     LABORATORY DATA:   I have reviewed the data as listed.  Results for orders placed or performed in visit on 05/23/24  C-reactive protein  Result Value Ref Range   CRP 0.6 <1.0 mg/dL  Sedimentation rate  Result Value Ref Range   Sed Rate 26 (H) 0 - 22 mm/hr  Ferritin  Result Value Ref Range   Ferritin 1,508 (H) 11 - 307 ng/mL  Iron and TIBC  Result Value Ref Range   Iron 83 28 - 170 ug/dL   TIBC 713 749 - 549 ug/dL   Saturation Ratios 29 10.4 - 31.8 %   UIBC 203 ug/dL  CMP (Cancer Center only)  Result Value Ref Range   Sodium 134 (L) 135 - 145 mmol/L   Potassium 4.4 3.5 - 5.1 mmol/L   Chloride 101 98 - 111 mmol/L   CO2 24 22 - 32 mmol/L   Glucose, Bld 109 (H) 70 - 99 mg/dL   BUN 26 (H) 8 - 23 mg/dL   Creatinine 8.77 (H) 9.55 - 1.00 mg/dL   Calcium 89.4 (H) 8.9 - 10.3 mg/dL   Total Protein 7.4 6.5 - 8.1 g/dL   Albumin 4.4 3.5 -  5.0 g/dL   AST 23 15 - 41 U/L   ALT 9 0 - 44 U/L   Alkaline Phosphatase 104 38 - 126 U/L   Total Bilirubin 0.2 0.0 - 1.2 mg/dL   GFR, Estimated 48 (L) >60 mL/min   Anion gap 9 5 - 15  CBC with Differential (Cancer Center Only)  Result Value Ref Range   WBC Count 4.4 4.0 - 10.5 K/uL   RBC 4.06 3.87 - 5.11 MIL/uL   Hemoglobin 13.0 12.0 - 15.0 g/dL   HCT 61.7 63.9 - 53.9 %   MCV 94.1 80.0 - 100.0 fL   MCH 32.0 26.0 - 34.0 pg   MCHC 34.0 30.0 - 36.0 g/dL   RDW 86.7 88.4 - 84.4 %   Platelet Count 207 150 - 400 K/uL   nRBC 0.0 0.0 - 0.2 %   Neutrophils Relative % 62 %   Neutro Abs 2.8 1.7 - 7.7 K/uL   Lymphocytes Relative 25 %   Lymphs Abs 1.1 0.7 - 4.0 K/uL   Monocytes Relative 11 %   Monocytes Absolute 0.5 0.1 - 1.0 K/uL   Eosinophils Relative 1 %   Eosinophils Absolute 0.0 0.0 - 0.5 K/uL   Basophils Relative 0 %   Basophils Absolute 0.0 0.0 - 0.1 K/uL  Immature Granulocytes 1 %   Abs Immature Granulocytes 0.02 0.00 - 0.07 K/uL     RADIOGRAPHIC STUDIES:  No recent pertinent imaging studies available to review.  Orders Placed This Encounter  Procedures   CBC with Differential (Cancer Center Only)    Standing Status:   Future    Number of Occurrences:   1    Expiration Date:   05/23/2025   CMP (Cancer Center only)    Standing Status:   Future    Number of Occurrences:   1    Expiration Date:   05/23/2025   Iron and TIBC    Standing Status:   Future    Number of Occurrences:   1    Expiration Date:   05/23/2025   Ferritin    Standing Status:   Future    Number of Occurrences:   1    Expiration Date:   05/23/2025   Hemochromatosis DNA, PCR    Standing Status:   Future    Number of Occurrences:   1    Expiration Date:   05/23/2025   Sedimentation rate    Standing Status:   Future    Number of Occurrences:   1    Expiration Date:   05/23/2025   C-reactive protein    Standing Status:   Future    Number of Occurrences:   1    Expiration Date:   05/23/2025    ANA w/Reflex if Positive    Standing Status:   Future    Number of Occurrences:   1    Expiration Date:   05/23/2025     I spent a total of 55 minutes during this encounter with the patient including review of chart and various tests results, discussions about plan of care and coordination of care plan.  This document was completed utilizing speech recognition software. Grammatical errors, random word insertions, pronoun errors, and incomplete sentences are an occasional consequence of this system due to software limitations, ambient noise, and hardware issues. Any formal questions or concerns about the content, text or information contained within the body of this dictation should be directly addressed to the provider for clarification.

## 2024-05-24 LAB — ANA W/REFLEX IF POSITIVE: Anti Nuclear Antibody (ANA): NEGATIVE

## 2024-05-29 LAB — HEMOCHROMATOSIS DNA-PCR(C282Y,H63D)

## 2024-06-06 ENCOUNTER — Encounter: Payer: Self-pay | Admitting: Oncology

## 2024-06-06 ENCOUNTER — Inpatient Hospital Stay: Admitting: Oncology

## 2024-06-06 DIAGNOSIS — R7989 Other specified abnormal findings of blood chemistry: Secondary | ICD-10-CM | POA: Diagnosis not present

## 2024-06-06 DIAGNOSIS — R700 Elevated erythrocyte sedimentation rate: Secondary | ICD-10-CM | POA: Diagnosis not present

## 2024-06-06 DIAGNOSIS — N189 Chronic kidney disease, unspecified: Secondary | ICD-10-CM

## 2024-06-06 NOTE — Assessment & Plan Note (Signed)
 Chronic elevated ferritin levels, previously 1800 ng/mL, now 1400 ng/mL. No anemia. Differential includes hemochromatosis or inflammation-related elevation.  On her consultation with us  on 05/23/2024, labs showed persistently increased ferritin of 1508.  CBCD was grossly unremarkable.  LFTs were within normal limits.  Creatinine 1.22.  Calcium 10.5.  Iron studies otherwise were unremarkable.  CRP normal at 0.6, sed rate increased at 26 mm/h.   Genetic testing for hemochromatosis mutations, including C282Y, H63D, S65C mutations were all negative.  This rules out HFE related hemochromatosis.   ANA checked to rule out any underlying autoimmune disorders and it was negative.   Given hypercalcemia in the context of renal dysfunction, we will rule out monoclonal gammopathy with next set of labs.  Elevated ferritin is likely inflammatory marker in her case.  Non-HFE related hemochromatosis is considered but she does not have any signs of endorgan damage, making it less likely.

## 2024-06-06 NOTE — Progress Notes (Signed)
 [facility]  HEMATOLOGY-ONCOLOGY ELECTRONIC VISIT PROGRESS NOTE  PATIENT NAME: Tonya Sims   MR#: [id number] DOB: 1955-10-10  DATE OF SERVICE: 06/06/2024  Patient Care Team: Delice Charleston, MD (Inactive) as PCP - General (Internal Medicine) Gearline Norris, MD as Consulting Physician (Nephrology)  I connected with the patient via telephone conference and verified that I am speaking with the correct person using two identifiers. The patient's location is at home and I am providing care from the [facility].  I discussed the limitations, risks, security and privacy concerns of performing an evaluation and management service by e-visits and the availability of in person appointments. I also discussed with the patient that there may be a patient responsible charge related to this service. The patient expressed understanding and agreed to proceed.   ASSESSMENT & PLAN:   Tonya Sims is a 68 y.o.  lady with a past medical history of hypertension, dyslipidemia, GERD, CKD stage III, history of 2 left renal arteries, chronic diastolic congestive heart failure, hypothyroidism, was referred to our service in October 2025 for evaluation of elevated ferritin.   Elevated ferritin level Chronic elevated ferritin levels, previously 1800 ng/mL, now 1400 ng/mL. No anemia. Differential includes hemochromatosis or inflammation-related elevation.  On her consultation with us  on 05/23/2024, labs showed persistently increased ferritin of 1508.  CBCD was grossly unremarkable.  LFTs were within normal limits.  Creatinine 1.22.  Calcium 10.5.  Iron studies otherwise were unremarkable.  CRP normal at 0.6, sed rate increased at 26 mm/h.   Genetic testing for hemochromatosis mutations, including C282Y, H63D, S65C mutations were all negative.  This rules out HFE related hemochromatosis.   ANA checked to rule out any underlying autoimmune disorders and it was negative.   Given  hypercalcemia in the context of renal dysfunction, we will rule out monoclonal gammopathy with next set of labs.  Elevated ferritin is likely inflammatory marker in her case.  Non-HFE related hemochromatosis is considered but she does not have any signs of endorgan damage, making it less likely.  Elevated erythrocyte sedimentation rate (ESR) ESR at 26, slightly above normal range, indicating possible inflammation. No specific source of inflammation identified, and autoimmune screening was negative. - Monitor ESR levels - Evaluate for potential sources of inflammation if ESR remains elevated  Chronic kidney disease Creatinine level at 1.22, slightly above normal. No significant changes in kidney function. Potassium levels were borderline high, requiring dietary adjustments. - Continue monitoring kidney function - Advise on dietary modifications to manage potassium levels  Mild hypercalcemia Calcium level at 10.5, slightly above normal range. No calcium supplements are being taken, and dietary intake includes cheese, milk, and green vegetables. No immediate intervention required. - Monitor calcium levels regularly - Advise on dietary intake of calcium-rich foods     I discussed the assessment and treatment plan with the patient. The patient was provided an opportunity to ask questions and all were answered. The patient agreed with the plan and demonstrated an understanding of the instructions. The patient was advised to call back or seek an in-person evaluation if the symptoms worsen or if the condition fails to improve as anticipated.    I spent 12 minutes over the phone with the patient reviewing test results, discuss management and coordination/planning of care.  Chinita Patten, MD 06/06/2024 4:12 PM [facility] A DEPT OF [facility] [address] [place] [address] Dept: [phone] Dept Fax: [phone]  INTERVAL HISTORY:  Please see above for problem oriented charting.  The purpose of today's discussion is to explain recent lab results and to formulate plan of care.  Discussed the use of AI scribe software for clinical note transcription with the patient, who gave verbal consent to proceed.  History of Present Illness Tonya Sims is a 68 year old female with chronic kidney disease who presents for evaluation of elevated ferritin levels. She was referred by Dr. Gearline for evaluation of elevated ferritin levels.  She has a history of chronic kidney disease with a creatinine level of 1.22, slightly above the normal range. Her potassium level is borderline high, and she follows a diet that includes pork, fish, poultry, and green vegetables.  Her ferritin levels have been chronically high, with the most recent level at 1500, slightly increased from 1473 in August, and previously 1800 last year. A genetic test for hemochromatosis was negative. Her iron and iron saturation levels are normal. Inflammatory markers, including the sed rate, were slightly elevated at 26. Tests for autoimmune conditions, including ANA for lupus, were negative.  She is currently feeling well without significant changes in her condition since the last appointment. Her previous leg pain has resolved. She does not take calcium supplements but consumes cheese, milk, and green vegetables, including spinach, which she has reduced in her diet. Her calcium level was 10.5, slightly elevated.     SUMMARY OF HEMATOLOGY HISTORY:  She was referred by Dr. Gearline for elevated ferritin levels chronically.    On 04/04/2024, labs at her nephrologist office showed hemoglobin of 12.5, hematocrit 38.3, MCV 97.  White count and platelet count were within normal limits.  Ferritin was elevated at 1473, iron binding capacity was slightly decreased at 226, normal iron of 91, normal iron saturation of 40%.  Creatinine was 1.22, calcium 10.2.   She was referred to us  for further evaluation of elevated ferritin.    She has a history of elevated ferritin levels, with a peak of 1800 ng/mL in 2023-2024, which decreased to 1400 ng/mL after discontinuing iron supplements. She has a past history of anemia but currently does not have low red blood cell counts. She was previously taking iron supplements but has stopped due to high ferritin levels.   She underwent a partial thyroidectomy over 20 years ago due to nodule growth on the right side of her thyroid . She currently has no nodules.   She experiences chronic kidney disease and hypertension, which are being monitored. Her blood pressure is generally well-controlled, though it can increase with physical activity or stress. She monitors her blood pressure at home with her own device.   She has a history of gastrointestinal issues, including nausea and burning sensations in her stomach, which led to a colonoscopy and endoscopy in 2023. The procedures revealed multiple polyps in her stomach, which were removed.   She has undergone physical therapy for neuropathy and sciatica, attending sessions at night. She has a history of neck issues involving C3 and L vertebrae, managed by her neurologist, Dr. Joshua.   On her consultation with us  on 05/23/2024, labs showed persistently increased ferritin of 1508.  CBCD was grossly unremarkable.  LFTs were within normal limits.  Creatinine 1.22.  Calcium 10.5.  Iron studies otherwise were unremarkable.  CRP normal at 0.6, sed rate increased at 26 mm/h.   Genetic testing for hemochromatosis mutations, including C282Y, H63D, S65C mutations were all negative.  This rules out HFE related hemochromatosis.   ANA checked  to rule out any underlying autoimmune disorders and it was negative.   Given hypercalcemia in the context of renal dysfunction, we will rule out monoclonal gammopathy with next set of labs.  Elevated ferritin is likely inflammatory marker in her  case.  Non-HFE related hemochromatosis is considered but she does not have any signs of endorgan damage, making it less likely.  REVIEW OF SYSTEMS:    Review of Systems - Oncology  All other pertinent systems were reviewed with the patient and are negative.  I have reviewed the past medical history, past surgical history, social history and family history with the patient and they are unchanged from previous note.  ALLERGIES:  She is allergic to d3 2000 [cholecalciferol], monopril [fosinopril], morphine and codeine, percocet [oxycodone-acetaminophen], and propoxyphene.  MEDICATIONS:  Current Outpatient Medications  Medication Sig Dispense Refill   acetaminophen (TYLENOL) 500 MG tablet Take 1,000 mg by mouth 3 (three) times daily as needed (for pain).     Ascorbic Acid (VITAMIN C) 1000 MG tablet Take 1,000 mg by mouth daily.     atenolol (TENORMIN) 50 MG tablet Take 50 mg by mouth 2 (two) times daily.  3   B Complex Vitamins (B COMPLEX 50 PO) Take 1 tablet by mouth daily.     butalbital-acetaminophen-caffeine (FIORICET, ESGIC) 50-325-40 MG tablet Take 1-2 tablets by mouth every 6 (six) hours as needed for migraine or headache.  1   cloNIDine (CATAPRES) 0.1 MG tablet Take 0.1 mg by mouth at bedtime.  11   cloNIDine (CATAPRES) 0.3 MG tablet Take 0.3 mg by mouth daily.     Coenzyme Q10 (CO Q10) 100 MG CAPS Take 100 mg by mouth daily.     EVENING PRIMROSE OIL PO Take 500 mg by mouth daily.     famotidine (PEPCID) 10 MG tablet Take 10 mg by mouth 2 (two) times daily.     fexofenadine (ALLEGRA) 180 MG tablet Take 180 mg by mouth at bedtime.     fluticasone (FLONASE) 50 MCG/ACT nasal spray Place 1 spray into both nostrils 2 (two) times daily.  5   gabapentin (NEURONTIN) 300 MG capsule Take 300 mg by mouth at bedtime.  1   levothyroxine (SYNTHROID) 88 MCG tablet Take 88 mcg by mouth daily before breakfast.     levothyroxine (SYNTHROID, LEVOTHROID) 100 MCG tablet Take 100 mcg by mouth daily at 6  (six) AM.  11   losartan-hydrochlorothiazide (HYZAAR) 100-25 MG tablet Take 1 tablet by mouth daily.     magnesium gluconate (MAGONATE) 500 (27 Mg) MG TABS tablet Take 500 mg by mouth in the morning and at bedtime.     Multiple Vitamin (MULTIVITAMIN) tablet Take 1 tablet by mouth daily.     Naphazoline-Pheniramine (OPCON-A) 0.027-0.315 % SOLN Place 1 drop into both eyes daily.     Omega-3 Fatty Acids (OMEGA 3 PO) Take 2 capsules by mouth daily. 1150 mg per 2 capsules     omeprazole (PRILOSEC) 20 MG capsule Take 20 mg by mouth See admin instructions. Once every evening & if needed in the morning  3   PREMARIN 0.3 MG tablet Take 0.3 mg by mouth daily.  11   spironolactone (ALDACTONE) 50 MG tablet Take 50 mg by mouth daily.     zolpidem (AMBIEN) 5 MG tablet Take 5 mg by mouth at bedtime.  4   No current facility-administered medications for this visit.    PHYSICAL EXAMINATION:  Not performed today as it was a phone only visit  LABORATORY DATA:   I have reviewed the data as listed.  Recent Results (from the past 2160 hours)  ANA w/Reflex if Positive     Status: None   Collection Time: 05/23/24 11:01 AM  Result Value Ref Range   Anti Nuclear Antibody (ANA) Negative Negative    Comment: (NOTE) Performed At: [facility] [address] [place], [place] [id number] Jennette Shorter MD Ey:[phone]   Ferritin     Status: Abnormal   Collection Time: 05/23/24 11:01 AM  Result Value Ref Range   Ferritin 1,508 (H) 11 - 307 ng/mL    Comment: Performed at [facility], [address] [place], [place] [address]  C-reactive protein     Status: None   Collection Time: 05/23/24 11:02 AM  Result Value Ref Range   CRP 0.6 <1.0 mg/dL    Comment: Performed at [facility], [address] [place], [place] [address]  Sedimentation rate     Status: Abnormal   Collection Time: 05/23/24 11:02 AM  Result Value Ref Range   Sed Rate 26 (H) 0 - 22 mm/hr    Comment:  Performed at [facility], [address], [place], [place] [address]  Hemochromatosis DNA, PCR     Status: None   Collection Time: 05/23/24 11:02 AM  Result Value Ref Range   DNA Mutation Analysis Comment     Comment: (NOTE) Result: c.845G>A (p.Cys282Tyr) - Not Detected c.187C>G (p.His63Asp) - Not Detected c.193A>T (p.Ser65Cys) - Not Detected Not associated with increased risk to develop clinical symptoms of Hereditary Hemochromatosis. In symptomatic individuals, other causes of iron overload should be evaluated. See Additional Information and Comments. Additional Clinical Information: Hereditary hemochromatosis (HFE related) is an autosomal recessive iron storage disorder. Patients may have a genetic diagnosis of hereditary hemochromatosis and never show clinical symptoms. Clinical symptoms typically appear between 40 to 60 years in males and after menopause in females. Signs and symptoms may include organ damage, primarily in the liver, risk for hepatocellular carcinoma, diabetes, and heart disease due to iron accumulation. Life expectancy may be decreased in individuals who develop cirrhosis. Treatment for clinically symptomatic individuals may include therapeutic phlebotomy. L iver transplant may be used to treat end stage liver failure. For preventive care, monitoring for iron overload is recommended for patients who are homozygous for c.845G>A (p.Cys282Tyr) and have yet to experience clinical symptoms. Comments: The most common HFE variants associated with hereditary hemochromatosis are c.845G>A (p.Cys282Tyr), c.187C>G (p.His63Asp), c.193A>T (p.Ser65Cys). While patients homozygous for c.845G>A (p.Cys282Tyr) are the most likely to present clinical symptoms, less than 10% develop clinically significant iron overload with tissue and organ damage. Genetic counseling is recommended to discuss the potential clinical implications of positive results, as well as  recommendations for testing family members. Genetic Coordinators are available for health care providers to discuss results at [phone]). Test Details: Three variants analyzed: c.845G>A (p.Cys282Tyr), commonly referred to as C282Y c.187C>G (p.His63Asp), commonly referred to a s H63D c.193A>T (p.Ser65Cys), commonly referred to as S65C Methods/Limitations: DNA Analysis of the HFE gene (NM_[id number]) was performed by PCR amplification followed by restriction enzyme digestion analyses. Results must be combined with clinical information for the most accurate interpretation. Molecular-based testing is highly accurate, but as in any laboratory test, diagnostic errors may occur. False positive or false negative results may occur for reasons that include genetic variants, blood transfusions, bone marrow transplantation, somatic or tissue-specific mosaicism, mislabeled samples, or erroneous representation of family relationships. This test was developed and its performance characteristics  determined by [facility]. It has not been cleared or approved by the Food and Drug Administration. References: Aldona CAVES, [address], Kowdley SONNA Monte LW, [place] AS; American Association for the Study of Liver Diseases. Diagnosis and management of hemochromatosis: 2011  practice guideline by the American Association for the Study of Liver Diseases. Hepatology. 2011 Jul;54(1):328-43. doi: 10.1002/hep.24330. PMID: 78547709; PMCID: EFR6850874. [address], Brissot P, Swinkels DW, Zoller H, Kamarainen O, Patton S, Alonso I, Morris M, Keeney S. EMQN best practice guidelines for the molecular genetic diagnosis of hereditary hemochromatosis [facility]). Eur J Hum Genet. 2016 Apr;24(4):479-95. doi: 10.1038/ejhg.2015.128. Epub 2015 Jul 8. PMID: 73846781; PMCID: EFR5070138.    Reviewed by: Comment     Comment: (NOTE) Technical Component performed at [facility] Professional Component performed by: Toni R. Prezant,  PhD, [facility] TPTGD5, [facility], [address] [place] [address] Performed At: [facility] [address] [place], [place] [id number] Loran Gales MDPhD Ey:[phone]   Iron and TIBC     Status: None   Collection Time: 05/23/24 11:02 AM  Result Value Ref Range   Iron 83 28 - 170 ug/dL   TIBC 713 749 - 549 ug/dL   Saturation Ratios 29 10.4 - 31.8 %   UIBC 203 ug/dL    Comment: Performed at [facility], [address] [place], [place] [address]  CMP (Cancer Center only)     Status: Abnormal   Collection Time: 05/23/24 11:02 AM  Result Value Ref Range   Sodium 134 (L) 135 - 145 mmol/L   Potassium 4.4 3.5 - 5.1 mmol/L   Chloride 101 98 - 111 mmol/L   CO2 24 22 - 32 mmol/L   Glucose, Bld 109 (H) 70 - 99 mg/dL    Comment: Glucose reference range applies only to samples taken after fasting for at least 8 hours.   BUN 26 (H) 8 - 23 mg/dL   Creatinine 8.77 (H) 9.55 - 1.00 mg/dL   Calcium 89.4 (H) 8.9 - 10.3 mg/dL   Total Protein 7.4 6.5 - 8.1 g/dL   Albumin 4.4 3.5 - 5.0 g/dL   AST 23 15 - 41 U/L   ALT 9 0 - 44 U/L   Alkaline Phosphatase 104 38 - 126 U/L   Total Bilirubin 0.2 0.0 - 1.2 mg/dL   GFR, Estimated 48 (L) >60 mL/min    Comment: (NOTE) Calculated using the CKD-EPI Creatinine Equation (2021)    Anion gap 9 5 - 15    Comment: Performed at [facility], [address] [place], [place] [address]  CBC with Differential (Cancer Center Only)     Status: None   Collection Time: 05/23/24 11:02 AM  Result Value Ref Range   WBC Count 4.4 4.0 - 10.5 K/uL   RBC 4.06 3.87 - 5.11 MIL/uL   Hemoglobin 13.0 12.0 - 15.0 g/dL   HCT 61.7 63.9 - 53.9 %   MCV 94.1 80.0 - 100.0 fL   MCH 32.0 26.0 - 34.0 pg   MCHC 34.0 30.0 - 36.0 g/dL   RDW 86.7 88.4 - 84.4 %   Platelet Count 207 150 - 400 K/uL   nRBC 0.0 0.0 - 0.2 %   Neutrophils Relative % 62 %   Neutro Abs 2.8 1.7 - 7.7 K/uL   Lymphocytes Relative 25 %   Lymphs Abs 1.1 0.7 - 4.0 K/uL   Monocytes  Relative 11 %   Monocytes Absolute 0.5 0.1 - 1.0 K/uL   Eosinophils Relative 1 %  Eosinophils Absolute 0.0 0.0 - 0.5 K/uL   Basophils Relative 0 %   Basophils Absolute 0.0 0.0 - 0.1 K/uL   Immature Granulocytes 1 %   Abs Immature Granulocytes 0.02 0.00 - 0.07 K/uL    Comment: Performed at [facility], [address] [place], [place] [address]     RADIOGRAPHIC STUDIES:  No recent pertinent imaging studies available to review.  Orders Placed This Encounter  Procedures   CBC with Differential (Cancer Center Only)    Standing Status:   Future    Expiration Date:   06/06/2025   CMP (Cancer Center only)    Standing Status:   Future    Expiration Date:   06/06/2025   Iron and TIBC    Standing Status:   Future    Expiration Date:   06/06/2025   Ferritin    Standing Status:   Future    Expiration Date:   06/06/2025   Lactate dehydrogenase    Standing Status:   Future    Expiration Date:   06/06/2025   Multiple Myeloma Panel (SPEP&IFE w/QIG)    Standing Status:   Future    Expiration Date:   06/06/2025   Kappa/lambda light chains    Standing Status:   Future    Expiration Date:   06/06/2025   Sedimentation rate    Standing Status:   Future    Expiration Date:   06/06/2025     Future Appointments  Date Time Provider Department Center  07/04/2024  9:15 AM [facility] PHLEBOTOMIST [facility] None  07/04/2024  9:45 AM Ambri Miltner, Chinita, MD [facility] None    This document was completed utilizing speech recognition software. Grammatical errors, random word insertions, pronoun errors, and incomplete sentences are an occasional consequence of this system due to software limitations, ambient noise, and hardware issues. Any formal questions or concerns about the content, text or information contained within the body of this dictation should be directly addressed to the provider for clarification.

## 2024-06-07 ENCOUNTER — Telehealth: Payer: Self-pay | Admitting: Oncology

## 2024-06-07 NOTE — Telephone Encounter (Signed)
 Patient has been scheduled for follow-up visit per 06/06/24 LOS.  LVM notifying pt of appt details, provided my direct number to pt if appt changes need to be made.

## 2024-06-15 DIAGNOSIS — E890 Postprocedural hypothyroidism: Secondary | ICD-10-CM | POA: Diagnosis not present

## 2024-06-15 DIAGNOSIS — E559 Vitamin D deficiency, unspecified: Secondary | ICD-10-CM | POA: Diagnosis not present

## 2024-06-20 DIAGNOSIS — G4700 Insomnia, unspecified: Secondary | ICD-10-CM | POA: Diagnosis not present

## 2024-06-20 DIAGNOSIS — M19049 Primary osteoarthritis, unspecified hand: Secondary | ICD-10-CM | POA: Diagnosis not present

## 2024-06-20 DIAGNOSIS — R7303 Prediabetes: Secondary | ICD-10-CM | POA: Diagnosis not present

## 2024-06-20 DIAGNOSIS — I130 Hypertensive heart and chronic kidney disease with heart failure and stage 1 through stage 4 chronic kidney disease, or unspecified chronic kidney disease: Secondary | ICD-10-CM | POA: Diagnosis not present

## 2024-06-20 DIAGNOSIS — R7989 Other specified abnormal findings of blood chemistry: Secondary | ICD-10-CM | POA: Diagnosis not present

## 2024-06-21 ENCOUNTER — Inpatient Hospital Stay: Attending: Oncology

## 2024-06-21 DIAGNOSIS — R7989 Other specified abnormal findings of blood chemistry: Secondary | ICD-10-CM | POA: Diagnosis not present

## 2024-06-21 DIAGNOSIS — N183 Chronic kidney disease, stage 3 unspecified: Secondary | ICD-10-CM | POA: Insufficient documentation

## 2024-06-21 DIAGNOSIS — I5032 Chronic diastolic (congestive) heart failure: Secondary | ICD-10-CM | POA: Insufficient documentation

## 2024-06-21 DIAGNOSIS — E1122 Type 2 diabetes mellitus with diabetic chronic kidney disease: Secondary | ICD-10-CM | POA: Diagnosis not present

## 2024-06-21 DIAGNOSIS — I130 Hypertensive heart and chronic kidney disease with heart failure and stage 1 through stage 4 chronic kidney disease, or unspecified chronic kidney disease: Secondary | ICD-10-CM | POA: Insufficient documentation

## 2024-06-21 LAB — CBC WITH DIFFERENTIAL (CANCER CENTER ONLY)
Abs Immature Granulocytes: 0.01 K/uL (ref 0.00–0.07)
Basophils Absolute: 0 K/uL (ref 0.0–0.1)
Basophils Relative: 0 %
Eosinophils Absolute: 0.1 K/uL (ref 0.0–0.5)
Eosinophils Relative: 2 %
HCT: 38.8 % (ref 36.0–46.0)
Hemoglobin: 13.1 g/dL (ref 12.0–15.0)
Immature Granulocytes: 0 %
Lymphocytes Relative: 42 %
Lymphs Abs: 2.2 K/uL (ref 0.7–4.0)
MCH: 32.3 pg (ref 26.0–34.0)
MCHC: 33.8 g/dL (ref 30.0–36.0)
MCV: 95.6 fL (ref 80.0–100.0)
Monocytes Absolute: 0.5 K/uL (ref 0.1–1.0)
Monocytes Relative: 10 %
Neutro Abs: 2.4 K/uL (ref 1.7–7.7)
Neutrophils Relative %: 46 %
Platelet Count: 220 K/uL (ref 150–400)
RBC: 4.06 MIL/uL (ref 3.87–5.11)
RDW: 13.3 % (ref 11.5–15.5)
WBC Count: 5.2 K/uL (ref 4.0–10.5)
nRBC: 0 % (ref 0.0–0.2)

## 2024-06-21 LAB — CMP (CANCER CENTER ONLY)
ALT: 9 U/L (ref 0–44)
AST: 21 U/L (ref 15–41)
Albumin: 4.3 g/dL (ref 3.5–5.0)
Alkaline Phosphatase: 107 U/L (ref 38–126)
Anion gap: 10 (ref 5–15)
BUN: 22 mg/dL (ref 8–23)
CO2: 25 mmol/L (ref 22–32)
Calcium: 10.4 mg/dL — ABNORMAL HIGH (ref 8.9–10.3)
Chloride: 101 mmol/L (ref 98–111)
Creatinine: 1.1 mg/dL — ABNORMAL HIGH (ref 0.44–1.00)
GFR, Estimated: 54 mL/min — ABNORMAL LOW (ref >60)
Glucose, Bld: 113 mg/dL — ABNORMAL HIGH (ref 70–99)
Potassium: 4 mmol/L (ref 3.5–5.1)
Sodium: 136 mmol/L (ref 135–145)
Total Bilirubin: 0.3 mg/dL (ref 0.0–1.2)
Total Protein: 7.7 g/dL (ref 6.5–8.1)

## 2024-06-21 LAB — IRON AND TIBC
Iron: 82 ug/dL (ref 28–170)
Saturation Ratios: 27 % (ref 10.4–31.8)
TIBC: 304 ug/dL (ref 250–450)
UIBC: 222 ug/dL

## 2024-06-21 LAB — LACTATE DEHYDROGENASE: LDH: 205 U/L (ref 105–235)

## 2024-06-21 LAB — SEDIMENTATION RATE: Sed Rate: 29 mm/h — ABNORMAL HIGH (ref 0–22)

## 2024-06-21 LAB — FERRITIN: Ferritin: 1326 ng/mL — ABNORMAL HIGH (ref 11–307)

## 2024-06-22 DIAGNOSIS — E049 Nontoxic goiter, unspecified: Secondary | ICD-10-CM | POA: Diagnosis not present

## 2024-06-22 DIAGNOSIS — E890 Postprocedural hypothyroidism: Secondary | ICD-10-CM | POA: Diagnosis not present

## 2024-06-22 DIAGNOSIS — I10 Essential (primary) hypertension: Secondary | ICD-10-CM | POA: Diagnosis not present

## 2024-06-22 DIAGNOSIS — E559 Vitamin D deficiency, unspecified: Secondary | ICD-10-CM | POA: Diagnosis not present

## 2024-06-22 LAB — KAPPA/LAMBDA LIGHT CHAINS
Kappa free light chain: 19.6 mg/L — ABNORMAL HIGH (ref 3.3–19.4)
Kappa, lambda light chain ratio: 1.09 (ref 0.26–1.65)
Lambda free light chains: 17.9 mg/L (ref 5.7–26.3)

## 2024-06-26 LAB — MULTIPLE MYELOMA PANEL, SERUM
Albumin SerPl Elph-Mcnc: 3.9 g/dL (ref 2.9–4.4)
Albumin/Glob SerPl: 1.2 (ref 0.7–1.7)
Alpha 1: 0.2 g/dL (ref 0.0–0.4)
Alpha2 Glob SerPl Elph-Mcnc: 0.7 g/dL (ref 0.4–1.0)
B-Globulin SerPl Elph-Mcnc: 1.1 g/dL (ref 0.7–1.3)
Gamma Glob SerPl Elph-Mcnc: 1.2 g/dL (ref 0.4–1.8)
Globulin, Total: 3.3 g/dL (ref 2.2–3.9)
IgA: 166 mg/dL (ref 87–352)
IgG (Immunoglobin G), Serum: 1280 mg/dL (ref 586–1602)
IgM (Immunoglobulin M), Srm: 97 mg/dL (ref 26–217)
Total Protein ELP: 7.2 g/dL (ref 6.0–8.5)

## 2024-07-04 ENCOUNTER — Encounter: Payer: Self-pay | Admitting: Oncology

## 2024-07-04 ENCOUNTER — Inpatient Hospital Stay: Admitting: Oncology

## 2024-07-04 ENCOUNTER — Inpatient Hospital Stay

## 2024-07-04 ENCOUNTER — Telehealth: Payer: Self-pay | Admitting: Oncology

## 2024-07-04 VITALS — BP 120/57 | HR 62 | Temp 97.6°F | Resp 18 | Ht 62.0 in | Wt 218.7 lb

## 2024-07-04 DIAGNOSIS — N183 Chronic kidney disease, stage 3 unspecified: Secondary | ICD-10-CM | POA: Diagnosis not present

## 2024-07-04 DIAGNOSIS — R7989 Other specified abnormal findings of blood chemistry: Secondary | ICD-10-CM | POA: Diagnosis not present

## 2024-07-04 DIAGNOSIS — I130 Hypertensive heart and chronic kidney disease with heart failure and stage 1 through stage 4 chronic kidney disease, or unspecified chronic kidney disease: Secondary | ICD-10-CM | POA: Diagnosis not present

## 2024-07-04 DIAGNOSIS — E1122 Type 2 diabetes mellitus with diabetic chronic kidney disease: Secondary | ICD-10-CM | POA: Diagnosis not present

## 2024-07-04 DIAGNOSIS — I5032 Chronic diastolic (congestive) heart failure: Secondary | ICD-10-CM | POA: Diagnosis not present

## 2024-07-04 NOTE — Progress Notes (Signed)
 [facility]  HEMATOLOGY CLINIC PROGRESS NOTE  PATIENT NAME: Tonya Sims   MR#: [id number] DOB: Oct 06, 1955  Patient Care Team: Delice Charleston, MD (Inactive) as PCP - General (Internal Medicine) Gearline Norris, MD as Consulting Physician (Nephrology)  Date of visit: 07/04/2024   ASSESSMENT & PLAN:   Tonya Sims is a 68 y.o.  lady with a past medical history of hypertension, dyslipidemia, GERD, CKD stage III, history of 2 left renal arteries, chronic diastolic congestive heart failure, hypothyroidism, was referred to our service in October 2025 for evaluation of elevated ferritin.  Testing negative for HFE related hemochromatosis.  Likely acute phase reactant.  Elevated ferritin level Chronic elevated ferritin levels, previously 1800 ng/mL, now 1400 ng/mL. No anemia. Differential includes hemochromatosis or inflammation-related elevation.  On her consultation with us  on 05/23/2024, labs showed persistently increased ferritin of 1508.  CBCD was grossly unremarkable.  LFTs were within normal limits.  Creatinine 1.22.  Calcium 10.5.  Iron studies otherwise were unremarkable.  CRP normal at 0.6, sed rate increased at 26 mm/h.   Genetic testing for hemochromatosis mutations, including C282Y, H63D, S65C mutations were all negative.  This rules out HFE related hemochromatosis.   ANA checked to rule out any underlying autoimmune disorders and it was negative.   Given hypercalcemia in the context of renal dysfunction, we will check myeloma labs on 06/21/2024 and there was no evidence of monoclonal gammopathy  Recent ferritin is better at 1326 on 06/21/2024, compared to 1800 previously.  Elevated ferritin is likely inflammatory marker in her case.  Non-HFE related hemochromatosis is considered but she does not have any signs of endorgan damage, making it less likely.  Currently no indication for therapeutic phlebotomies.  Will continue close monitoring.  RTC in 3  months for follow-up with repeat labs.   Hypercalcemia with improved renal function Calcium level remains slightly elevated at 10.4, previously 10.5. Renal function has improved with creatinine decreasing from 1.22 in October to 1.1. No evidence of myeloma or bone marrow issues. Improved hydration likely contributing to renal function improvement. - Continue adequate hydration to maintain renal function and manage calcium levels.  Osteoarthritis of the hand (suspected) Negative workup for rheumatoid arthritis at her PCPs office. Suspected osteoarthritis in the hand based on clinical evaluation. - Continue to monitor symptoms and manage conservatively.  I spent a total of 30 minutes during this encounter with the patient including review of chart and various tests results, discussions about plan of care and coordination of care plan.  I reviewed lab results and outside records for this visit and discussed relevant results with the patient. Diagnosis, plan of care and treatment options were also discussed in detail with the patient. Opportunity provided to ask questions and answers provided to her apparent satisfaction. Provided instructions to call our clinic with any problems, questions or concerns prior to return visit. I recommended to continue follow-up with PCP and sub-specialists. She verbalized understanding and agreed with the plan. No barriers to learning was detected.  Chinita Patten, MD  07/04/2024 11:24 AM  [facility] - A DEPT OF [facility] [address] [place] [address] Dept: [phone] Dept Fax: [phone]   CHIEF COMPLAINT/ REASON FOR VISIT:  Follow-up for elevated ferritin.  Testing negative for HFE related hemochromatosis.  Likely acute phase reactant.  INTERVAL HISTORY:  Discussed the use of AI scribe software for clinical note transcription with the patient, who gave verbal  consent to  proceed.  History of Present Illness Tonya Sims is a 68 year old female who presents for follow-up of elevated calcium levels and possible osteoarthritis.  She has elevated calcium levels, with recent lab results showing a calcium level of 10.4, slightly reduced from previous levels of 10.5 and higher. She maintains hydration by drinking a lot of water, which has improved her kidney function, with creatinine levels decreasing from 1.22 in October to 1.1 currently.  She received results indicating the absence of rheumatoid arthritis after a negative workup. She has not seen a rheumatologist but received results indicating the absence of rheumatoid arthritis.  Her ferritin levels have been high, previously at 1800 last year, 1500 at the first visit, and now at 1300. Genetic testing did not indicate hemochromatosis. Her inflammatory markers, such as the ESR, have been slightly elevated, with a recent reading of 29, up from 26 in October. Her liver function tests remain normal.  She has made dietary changes to avoid weight gain, including portion control and reducing intake of certain foods like cheeses and nuts. She uses a pedometer to monitor her physical activity and is mindful of her nutrition, aiming for moderation in her diet.   SUMMARY OF HEMATOLOGIC HISTORY:  She was referred by Dr. Gearline for elevated ferritin levels chronically.    On 04/04/2024, labs at her nephrologist office showed hemoglobin of 12.5, hematocrit 38.3, MCV 97.  White count and platelet count were within normal limits.  Ferritin was elevated at 1473, iron binding capacity was slightly decreased at 226, normal iron of 91, normal iron saturation of 40%.  Creatinine was 1.22, calcium 10.2.  She was referred to us  for further evaluation of elevated ferritin.    She has a history of elevated ferritin levels, with a peak of 1800 ng/mL in 2023-2024, which decreased to 1400 ng/mL after discontinuing iron supplements. She has  a past history of anemia but currently does not have low red blood cell counts. She was previously taking iron supplements but has stopped due to high ferritin levels.   She underwent a partial thyroidectomy over 20 years ago due to nodule growth on the right side of her thyroid . She currently has no nodules.   She experiences chronic kidney disease and hypertension, which are being monitored. Her blood pressure is generally well-controlled, though it can increase with physical activity or stress. She monitors her blood pressure at home with her own device.   She has a history of gastrointestinal issues, including nausea and burning sensations in her stomach, which led to a colonoscopy and endoscopy in 2023. The procedures revealed multiple polyps in her stomach, which were removed.   She has undergone physical therapy for neuropathy and sciatica, attending sessions at night. She has a history of neck issues involving C3 and L vertebrae, managed by her neurologist, Dr. Joshua.   On her consultation with us  on 05/23/2024, labs showed persistently increased ferritin of 1508.  CBCD was grossly unremarkable.  LFTs were within normal limits.  Creatinine 1.22.  Calcium 10.5.  Iron studies otherwise were unremarkable.  CRP normal at 0.6, sed rate increased at 26 mm/h.   Genetic testing for hemochromatosis mutations, including C282Y, H63D, S65C mutations were all negative.  This rules out HFE related hemochromatosis.   ANA checked to rule out any underlying autoimmune disorders and it was negative.   Given hypercalcemia in the context of renal dysfunction, we will rule out monoclonal gammopathy with next set of labs.  Elevated ferritin is likely inflammatory marker in her case.  Non-HFE related hemochromatosis is considered but she does not have any signs of endorgan damage, making it less likely.  I have reviewed the past medical history, past surgical history, social history and family history with the  patient and they are unchanged from previous note.  ALLERGIES: She is allergic to d3 2000 [cholecalciferol], monopril [fosinopril], morphine and codeine, percocet [oxycodone-acetaminophen], and propoxyphene.  MEDICATIONS:  Current Outpatient Medications  Medication Sig Dispense Refill   acetaminophen (TYLENOL) 500 MG tablet Take 1,000 mg by mouth 3 (three) times daily as needed (for pain).     Ascorbic Acid (VITAMIN C) 1000 MG tablet Take 1,000 mg by mouth daily.     atenolol (TENORMIN) 50 MG tablet Take 50 mg by mouth 2 (two) times daily.  3   B Complex Vitamins (B COMPLEX 50 PO) Take 1 tablet by mouth daily.     butalbital-acetaminophen-caffeine (FIORICET, ESGIC) 50-325-40 MG tablet Take 1-2 tablets by mouth every 6 (six) hours as needed for migraine or headache.  1   cloNIDine (CATAPRES) 0.1 MG tablet Take 0.1 mg by mouth at bedtime.  11   cloNIDine (CATAPRES) 0.3 MG tablet Take 0.3 mg by mouth daily.     Coenzyme Q10 (CO Q10) 100 MG CAPS Take 100 mg by mouth daily.     EVENING PRIMROSE OIL PO Take 500 mg by mouth daily.     famotidine (PEPCID) 10 MG tablet Take 10 mg by mouth 2 (two) times daily.     fexofenadine (ALLEGRA) 180 MG tablet Take 180 mg by mouth at bedtime.     fluticasone (FLONASE) 50 MCG/ACT nasal spray Place 1 spray into both nostrils 2 (two) times daily.  5   gabapentin (NEURONTIN) 300 MG capsule Take 300 mg by mouth at bedtime.  1   levothyroxine (SYNTHROID) 88 MCG tablet Take 88 mcg by mouth daily before breakfast.     losartan-hydrochlorothiazide (HYZAAR) 100-25 MG tablet Take 1 tablet by mouth daily.     magnesium gluconate (MAGONATE) 500 (27 Mg) MG TABS tablet Take 500 mg by mouth in the morning and at bedtime.     Multiple Vitamin (MULTIVITAMIN) tablet Take 1 tablet by mouth daily.     Naphazoline-Pheniramine (OPCON-A) 0.027-0.315 % SOLN Place 1 drop into both eyes daily.     Omega-3 Fatty Acids (OMEGA 3 PO) Take 2 capsules by mouth daily. 1150 mg per 2 capsules      omeprazole (PRILOSEC) 20 MG capsule Take 20 mg by mouth See admin instructions. Once every evening & if needed in the morning  3   PREMARIN 0.3 MG tablet Take 0.3 mg by mouth daily.  11   spironolactone (ALDACTONE) 50 MG tablet Take 50 mg by mouth daily.     zolpidem (AMBIEN) 5 MG tablet Take 5 mg by mouth at bedtime.  4   No current facility-administered medications for this visit.     REVIEW OF SYSTEMS:    Review of Systems - Oncology  All other pertinent systems were reviewed with the patient and are negative.  PHYSICAL EXAMINATION:    Onc Performance Status - 07/04/24 0956       ECOG Perf Status   ECOG Perf Status Restricted in physically strenuous activity but ambulatory and able to carry out work of a light or sedentary nature, e.g., light house work, office work      KPS SCALE   KPS % SCORE Normal activity with effort, some  s/s of disease          Vitals:   07/04/24 0945 07/04/24 1005  BP: (!) 153/60 (!) 120/57  Pulse: 62   Resp: 18   Temp: 97.6 F (36.4 C)   SpO2: 100%    Filed Weights   07/04/24 0945  Weight: 218 lb 11.2 oz (99.2 kg)    Physical Exam Constitutional:      General: She is not in acute distress.    Appearance: Normal appearance.  HENT:     Head: Normocephalic and atraumatic.  Cardiovascular:     Rate and Rhythm: Normal rate.  Pulmonary:     Effort: Pulmonary effort is normal. No respiratory distress.  Abdominal:     General: There is no distension.  Neurological:     General: No focal deficit present.     Mental Status: She is alert and oriented to person, place, and time.  Psychiatric:        Mood and Affect: Mood normal.        Behavior: Behavior normal.     LABORATORY DATA:   I have reviewed the data as listed.  No results found for any visits on 07/04/24.  Recent Results (from the past 2160 hours)  ANA w/Reflex if Positive     Status: None   Collection Time: 05/23/24 11:01 AM  Result Value Ref Range   Anti  Nuclear Antibody (ANA) Negative Negative    Comment: (NOTE) Performed At: [facility] [address] [place], [place] [id number] Jennette Shorter MD Ey:[id number]   Ferritin     Status: Abnormal   Collection Time: 05/23/24 11:01 AM  Result Value Ref Range   Ferritin 1,508 (H) 11 - 307 ng/mL    Comment: Performed at [facility], [address], [place], [place] [address]  C-reactive protein     Status: None   Collection Time: 05/23/24 11:02 AM  Result Value Ref Range   CRP 0.6 <1.0 mg/dL    Comment: Performed at [facility], [address], [place], [place] [address]  Sedimentation rate     Status: Abnormal   Collection Time: 05/23/24 11:02 AM  Result Value Ref Range   Sed Rate 26 (H) 0 - 22 mm/hr    Comment: Performed at [facility], [address], [place], [place] [address]  Hemochromatosis DNA, PCR     Status: None   Collection Time: 05/23/24 11:02 AM  Result Value Ref Range   DNA Mutation Analysis Comment     Comment: (NOTE) Result: c.845G>A (p.Cys282Tyr) - Not Detected c.187C>G (p.His63Asp) - Not Detected c.193A>T (p.Ser65Cys) - Not Detected Not associated with increased risk to develop clinical symptoms of Hereditary Hemochromatosis. In symptomatic individuals, other causes of iron overload should be evaluated. See Additional Information and Comments. Additional Clinical Information: Hereditary hemochromatosis (HFE related) is an autosomal recessive iron storage disorder. Patients may have a genetic diagnosis of hereditary hemochromatosis and never show clinical symptoms. Clinical symptoms typically appear between 40 to 60 years in males and after menopause in females. Signs and symptoms may include organ damage, primarily in the liver, risk for hepatocellular carcinoma, diabetes, and heart disease due to iron accumulation. Life expectancy may be decreased in individuals who develop cirrhosis. Treatment  for clinically symptomatic individuals may include therapeutic phlebotomy. L iver transplant may be used to treat end stage liver failure. For preventive care, monitoring for iron overload is recommended for patients who are homozygous for c.845G>A (p.Cys282Tyr) and have yet to  experience clinical symptoms. Comments: The most common HFE variants associated with hereditary hemochromatosis are c.845G>A (p.Cys282Tyr), c.187C>G (p.His63Asp), c.193A>T (p.Ser65Cys). While patients homozygous for c.845G>A (p.Cys282Tyr) are the most likely to present clinical symptoms, less than 10% develop clinically significant iron overload with tissue and organ damage. Genetic counseling is recommended to discuss the potential clinical implications of positive results, as well as recommendations for testing family members. Genetic Coordinators are available for health care providers to discuss results at [phone]). Test Details: Three variants analyzed: c.845G>A (p.Cys282Tyr), commonly referred to as C282Y c.187C>G (p.His63Asp), commonly referred to a s H63D c.193A>T (p.Ser65Cys), commonly referred to as S65C Methods/Limitations: DNA Analysis of the HFE gene (NM_[id number]) was performed by PCR amplification followed by restriction enzyme digestion analyses. Results must be combined with clinical information for the most accurate interpretation. Molecular-based testing is highly accurate, but as in any laboratory test, diagnostic errors may occur. False positive or false negative results may occur for reasons that include genetic variants, blood transfusions, bone marrow transplantation, somatic or tissue-specific mosaicism, mislabeled samples, or erroneous representation of family relationships. This test was developed and its performance characteristics determined by [facility]. It has not been cleared or approved by the Food and Drug Administration. References: [facility] CAVES, [address] [place]  SONNA Monte LW, Tavill AS; American Association for the Study of Liver Diseases. Diagnosis and management of hemochromatosis: 2011  practice guideline by the American Association for the Study of Liver Diseases. Hepatology. 2011 Jul;54(1):328-43. doi: 10.1002/hep.24330. PMID: 78547709; PMCID: EFR6850874. [address] Brissot P, Swinkels DW, Zoller H, Kamarainen O, Patton S, Alonso I, Morris M, Keeney S. EMQN best practice guidelines for the molecular genetic diagnosis of hereditary hemochromatosis [facility]). Eur J Hum Genet. 2016 Apr;24(4):479-95. doi: 10.1038/ejhg.2015.128. Epub 2015 Jul 8. PMID: 73846781; PMCID: EFR5070138.    Reviewed by: Comment     Comment: (NOTE) Technical Component performed at [facility] Professional Component performed by: Toni R. Prezant, PhD, [facility] TPTGD5, [facility], [address] [place] [address] Performed At: [facility] [address] [place], [place] [id number] Loran Gales MDPhD Ey:[id number]   Iron and TIBC     Status: None   Collection Time: 05/23/24 11:02 AM  Result Value Ref Range   Iron 83 28 - 170 ug/dL   TIBC 713 749 - 549 ug/dL   Saturation Ratios 29 10.4 - 31.8 %   UIBC 203 ug/dL    Comment: Performed at [facility], [address] [place], [place] [address]  CMP (Cancer Center only)     Status: Abnormal   Collection Time: 05/23/24 11:02 AM  Result Value Ref Range   Sodium 134 (L) 135 - 145 mmol/L   Potassium 4.4 3.5 - 5.1 mmol/L   Chloride 101 98 - 111 mmol/L   CO2 24 22 - 32 mmol/L   Glucose, Bld 109 (H) 70 - 99 mg/dL    Comment: Glucose reference range applies only to samples taken after fasting for at least 8 hours.   BUN 26 (H) 8 - 23 mg/dL   Creatinine 8.77 (H) 9.55 - 1.00 mg/dL   Calcium 89.4 (H) 8.9 - 10.3 mg/dL   Total Protein 7.4 6.5 - 8.1 g/dL   Albumin 4.4 3.5 - 5.0 g/dL   AST 23 15 - 41 U/L   ALT 9 0 - 44 U/L   Alkaline Phosphatase 104 38 - 126 U/L   Total Bilirubin 0.2 0.0 - 1.2 mg/dL   GFR, Estimated 48 (L)  >60 mL/min  Comment: (NOTE) Calculated using the CKD-EPI Creatinine Equation (2021)    Anion gap 9 5 - 15    Comment: Performed at [facility], [address], [place], [place] [address]  CBC with Differential (Cancer Center Only)     Status: None   Collection Time: 05/23/24 11:02 AM  Result Value Ref Range   WBC Count 4.4 4.0 - 10.5 K/uL   RBC 4.06 3.87 - 5.11 MIL/uL   Hemoglobin 13.0 12.0 - 15.0 g/dL   HCT 61.7 63.9 - 53.9 %   MCV 94.1 80.0 - 100.0 fL   MCH 32.0 26.0 - 34.0 pg   MCHC 34.0 30.0 - 36.0 g/dL   RDW 86.7 88.4 - 84.4 %   Platelet Count 207 150 - 400 K/uL   nRBC 0.0 0.0 - 0.2 %   Neutrophils Relative % 62 %   Neutro Abs 2.8 1.7 - 7.7 K/uL   Lymphocytes Relative 25 %   Lymphs Abs 1.1 0.7 - 4.0 K/uL   Monocytes Relative 11 %   Monocytes Absolute 0.5 0.1 - 1.0 K/uL   Eosinophils Relative 1 %   Eosinophils Absolute 0.0 0.0 - 0.5 K/uL   Basophils Relative 0 %   Basophils Absolute 0.0 0.0 - 0.1 K/uL   Immature Granulocytes 1 %   Abs Immature Granulocytes 0.02 0.00 - 0.07 K/uL    Comment: Performed at [facility], [address], [place], [place] [address]  Sedimentation rate     Status: Abnormal   Collection Time: 06/21/24  9:25 AM  Result Value Ref Range   Sed Rate 29 (H) 0 - 22 mm/hr    Comment: Performed at [facility], [address] [place], [place] [address]  Kappa/lambda light chains     Status: Abnormal   Collection Time: 06/21/24  9:25 AM  Result Value Ref Range   Kappa free light chain 19.6 (H) 3.3 - 19.4 mg/L   Lambda free light chains 17.9 5.7 - 26.3 mg/L   Kappa, lambda light chain ratio 1.09 0.26 - 1.65    Comment: (NOTE) Performed At: [facility] [address] [place], [place] [id number] Jennette Shorter MD Ey:[id number]   Multiple Myeloma Panel (SPEP&IFE w/QIG)     Status: None   Collection Time: 06/21/24  9:25 AM  Result Value Ref Range   IgG (Immunoglobin G), Serum  1,280 586 - 1,602 mg/dL   IgA 833 87 - 647 mg/dL   IgM (Immunoglobulin M), Srm 97 26 - 217 mg/dL   Total Protein ELP 7.2 6.0 - 8.5 g/dL   Albumin SerPl Elph-Mcnc 3.9 2.9 - 4.4 g/dL   Alpha 1 0.2 0.0 - 0.4 g/dL   Alpha2 Glob SerPl Elph-Mcnc 0.7 0.4 - 1.0 g/dL   B-Globulin SerPl Elph-Mcnc 1.1 0.7 - 1.3 g/dL   Gamma Glob SerPl Elph-Mcnc 1.2 0.4 - 1.8 g/dL   M Protein SerPl Elph-Mcnc Not Observed Not Observed g/dL   Globulin, Total 3.3 2.2 - 3.9 g/dL   Albumin/Glob SerPl 1.2 0.7 - 1.7   IFE 1 Comment     Comment: (NOTE) The immunofixation pattern appears unremarkable. Evidence of monoclonal protein is not apparent.    Please Note Comment     Comment: (NOTE) Protein electrophoresis scan will follow via computer, mail, or courier delivery. Performed At: [facility] [address] [place], [place] [id number] Jennette Shorter MD Ey:[id number]   Lactate dehydrogenase     Status: None   Collection Time: 06/21/24  9:25 AM  Result Value Ref Range   LDH 205 105 - 235 U/L    Comment: Please note change in reference range. Performed at [facility], [address] [place], [place] [address]   Ferritin     Status: Abnormal   Collection Time: 06/21/24  9:25 AM  Result Value Ref Range   Ferritin 1,326 (H) 11 - 307 ng/mL    Comment: Performed at [facility], [address], [place], [place] [address]  Iron and TIBC     Status: None   Collection Time: 06/21/24  9:25 AM  Result Value Ref Range   Iron 82 28 - 170 ug/dL   TIBC 695 749 - 549 ug/dL   Saturation Ratios 27 10.4 - 31.8 %   UIBC 222 ug/dL    Comment: Performed at [facility], [address], [place], [place] [address]  CMP (Cancer Center only)     Status: Abnormal   Collection Time: 06/21/24  9:25 AM  Result Value Ref Range   Sodium 136 135 - 145 mmol/L   Potassium 4.0 3.5 - 5.1 mmol/L   Chloride 101 98 - 111 mmol/L   CO2 25 22 - 32 mmol/L   Glucose, Bld 113 (H) 70 - 99  mg/dL    Comment: Glucose reference range applies only to samples taken after fasting for at least 8 hours.   BUN 22 8 - 23 mg/dL   Creatinine 8.89 (H) 9.55 - 1.00 mg/dL   Calcium 89.5 (H) 8.9 - 10.3 mg/dL   Total Protein 7.7 6.5 - 8.1 g/dL   Albumin 4.3 3.5 - 5.0 g/dL   AST 21 15 - 41 U/L   ALT 9 0 - 44 U/L   Alkaline Phosphatase 107 38 - 126 U/L   Total Bilirubin 0.3 0.0 - 1.2 mg/dL   GFR, Estimated 54 (L) >60 mL/min    Comment: (NOTE) Calculated using the CKD-EPI Creatinine Equation (2021)    Anion gap 10 5 - 15    Comment: Performed at [facility], [address] [place], [place] [address]  CBC with Differential (Cancer Center Only)     Status: None   Collection Time: 06/21/24  9:25 AM  Result Value Ref Range   WBC Count 5.2 4.0 - 10.5 K/uL   RBC 4.06 3.87 - 5.11 MIL/uL   Hemoglobin 13.1 12.0 - 15.0 g/dL   HCT 61.1 63.9 - 53.9 %   MCV 95.6 80.0 - 100.0 fL   MCH 32.3 26.0 - 34.0 pg   MCHC 33.8 30.0 - 36.0 g/dL   RDW 86.6 88.4 - 84.4 %   Platelet Count 220 150 - 400 K/uL   nRBC 0.0 0.0 - 0.2 %   Neutrophils Relative % 46 %   Neutro Abs 2.4 1.7 - 7.7 K/uL   Lymphocytes Relative 42 %   Lymphs Abs 2.2 0.7 - 4.0 K/uL   Monocytes Relative 10 %   Monocytes Absolute 0.5 0.1 - 1.0 K/uL   Eosinophils Relative 2 %   Eosinophils Absolute 0.1 0.0 - 0.5 K/uL   Basophils Relative 0 %   Basophils Absolute 0.0 0.0 - 0.1 K/uL   Immature Granulocytes 0 %   Abs Immature Granulocytes 0.01 0.00 - 0.07 K/uL    Comment: Performed at [facility], [address] [place], [place] [address]     RADIOGRAPHIC STUDIES:  No recent pertinent imaging studies available to review.  Orders Placed This Encounter  Procedures   CBC  with Differential (Cancer Center Only)    Standing Status:   Future    Expected Date:   10/04/2024    Expiration Date:   01/02/2025   CMP (Cancer Center only)    Standing Status:   Future    Expected Date:   10/04/2024     Expiration Date:   01/02/2025   Iron and TIBC    Standing Status:   Future    Expected Date:   10/04/2024    Expiration Date:   01/02/2025   Ferritin    Standing Status:   Future    Expected Date:   10/04/2024    Expiration Date:   01/02/2025   Sedimentation rate    Standing Status:   Future    Expected Date:   10/04/2024    Expiration Date:   01/02/2025       This document was completed utilizing speech recognition software. Grammatical errors, random word insertions, pronoun errors, and incomplete sentences are an occasional consequence of this system due to software limitations, ambient noise, and hardware issues. Any formal questions or concerns about the content, text or information contained within the body of this dictation should be directly addressed to the provider for clarification.

## 2024-07-04 NOTE — Assessment & Plan Note (Signed)
 Chronic elevated ferritin levels, previously 1800 ng/mL, now 1400 ng/mL. No anemia. Differential includes hemochromatosis or inflammation-related elevation.  On her consultation with us  on 05/23/2024, labs showed persistently increased ferritin of 1508.  CBCD was grossly unremarkable.  LFTs were within normal limits.  Creatinine 1.22.  Calcium 10.5.  Iron studies otherwise were unremarkable.  CRP normal at 0.6, sed rate increased at 26 mm/h.   Genetic testing for hemochromatosis mutations, including C282Y, H63D, S65C mutations were all negative.  This rules out HFE related hemochromatosis.   ANA checked to rule out any underlying autoimmune disorders and it was negative.   Given hypercalcemia in the context of renal dysfunction, we will check myeloma labs on 06/21/2024 and there was no evidence of monoclonal gammopathy  Recent ferritin is better at 1326 on 06/21/2024, compared to 1800 previously.  Elevated ferritin is likely inflammatory marker in her case.  Non-HFE related hemochromatosis is considered but she does not have any signs of endorgan damage, making it less likely.  Currently no indication for therapeutic phlebotomies.  Will continue close monitoring.  RTC in 3 months for follow-up with repeat labs.

## 2024-10-02 ENCOUNTER — Inpatient Hospital Stay

## 2024-10-03 ENCOUNTER — Inpatient Hospital Stay: Admitting: Oncology
